# Patient Record
Sex: Female | Born: 1980 | Race: White | Hispanic: No | Marital: Single | Smoking: Never smoker
Health system: Southern US, Community
[De-identification: ages and names within clinical notes are randomized; demographics above are authoritative.]

## PROBLEM LIST (undated history)

## (undated) DIAGNOSIS — J45909 Unspecified asthma, uncomplicated: Secondary | ICD-10-CM

## (undated) HISTORY — PX: THERAPEUTIC ABORTION: SHX798

## (undated) HISTORY — PX: WISDOM TOOTH EXTRACTION: SHX21

---

## 2001-02-23 ENCOUNTER — Inpatient Hospital Stay (HOSPITAL_COMMUNITY): Admission: AD | Admit: 2001-02-23 | Discharge: 2001-02-25 | Payer: Self-pay | Admitting: Obstetrics and Gynecology

## 2001-05-18 ENCOUNTER — Encounter: Payer: Self-pay | Admitting: Obstetrics and Gynecology

## 2001-05-18 ENCOUNTER — Ambulatory Visit (HOSPITAL_COMMUNITY): Admission: RE | Admit: 2001-05-18 | Discharge: 2001-05-18 | Payer: Self-pay | Admitting: Obstetrics and Gynecology

## 2001-07-06 ENCOUNTER — Other Ambulatory Visit: Admission: RE | Admit: 2001-07-06 | Discharge: 2001-07-06 | Payer: Self-pay | Admitting: Obstetrics and Gynecology

## 2006-02-12 ENCOUNTER — Ambulatory Visit: Payer: Self-pay | Admitting: Family Medicine

## 2007-11-23 ENCOUNTER — Emergency Department (HOSPITAL_COMMUNITY): Admission: EM | Admit: 2007-11-23 | Discharge: 2007-11-23 | Payer: Self-pay | Admitting: Emergency Medicine

## 2009-07-12 ENCOUNTER — Emergency Department (HOSPITAL_COMMUNITY): Admission: EM | Admit: 2009-07-12 | Discharge: 2009-07-12 | Payer: Self-pay | Admitting: Emergency Medicine

## 2010-02-07 ENCOUNTER — Ambulatory Visit: Payer: Self-pay | Admitting: Family Medicine

## 2010-02-07 DIAGNOSIS — S6390XA Sprain of unspecified part of unspecified wrist and hand, initial encounter: Secondary | ICD-10-CM | POA: Insufficient documentation

## 2010-10-28 NOTE — Assessment & Plan Note (Signed)
Summary: L pinky finger pain x 1.5 wk rm 3   Vital Signs:  Patient Profile:   30 Years Old Female CC:      L pinky finger x 1.5 wk ago Height:     65.5 inches Weight:      166 pounds O2 Sat:      100 % O2 treatment:    Room Air Temp:     98.6 degrees F oral Pulse rate:   98 / minute Pulse rhythm:   regular Resp:     16 per minute BP sitting:   121 / 789  (right arm) Cuff size:   regular  Vitals Entered By: Areta Haber CMA (Feb 07, 2010 7:12 PM)                  Current Allergies (reviewed today): ! FLAGYL     History of Present Illness Chief Complaint: L pinky finger x 1.5 wk ago History of Present Illness: Subjective:  Patient complains of sudden onset of pain in her left 5th fingertip 2 weeks ago when she was squeezing water from some fabric.  REVIEW OF SYSTEMS Constitutional Symptoms      Denies fever, chills, night sweats, weight loss, weight gain, and fatigue.  Eyes       Denies change in vision, eye pain, eye discharge, glasses, contact lenses, and eye surgery. Ear/Nose/Throat/Mouth       Denies hearing loss/aids, change in hearing, ear pain, ear discharge, dizziness, frequent runny nose, frequent nose bleeds, sinus problems, sore throat, hoarseness, and tooth pain or bleeding.  Respiratory       Denies dry cough, productive cough, wheezing, shortness of breath, asthma, bronchitis, and emphysema/COPD.  Cardiovascular       Denies murmurs, chest pain, and tires easily with exhertion.    Gastrointestinal       Denies stomach pain, nausea/vomiting, diarrhea, constipation, blood in bowel movements, and indigestion. Genitourniary       Denies painful urination, kidney stones, and loss of urinary control. Neurological       Denies paralysis, seizures, and fainting/blackouts. Musculoskeletal       Complains of decreased range of motion, redness, and swelling.      Denies muscle pain, joint pain, joint stiffness, muscle weakness, and gout.      Comments: L  Pinky finger x 1.5 wk  Skin       Denies bruising, unusual mles/lumps or sores, and hair/skin or nail changes.  Psych       Denies mood changes, temper/anger issues, anxiety/stress, speech problems, depression, and sleep problems. Other Comments: Pt has not seen PCP for this.    Objective:  Left 5th finger:  vague tenderness distal phalanx without deformity, swelling, erythema.  All joints have full range of motion.  Distal neurovascular intact  X-ray left 5th finger:  negative Assessment New Problems: FINGER SPRAIN (ICD-842.10)   Plan New Orders: T-DG Finger Little*L* [73140] New Patient Level III [id number] Planning Comments:   Ibuprofen 200mg , 4 tabs every 8 hours with food.  Begin range of motion exercises ([employer] information and instruction patient handout given).  Apply ice pack 2 or 3 times daily. Follow-up with PCP if not improving.   The patient and/or caregiver has been counseled thoroughly with regard to medications prescribed including dosage, schedule, interactions, rationale for use, and possible side effects and they verbalize understanding.  Diagnoses and expected course of recovery discussed and will return if not improved as expected or if the condition worsens.  Patient and/or caregiver verbalized understanding.

## 2016-12-30 ENCOUNTER — Other Ambulatory Visit: Payer: Self-pay | Admitting: Adult Health

## 2017-07-02 ENCOUNTER — Ambulatory Visit: Payer: Self-pay | Admitting: Family Medicine

## 2017-07-06 NOTE — Progress Notes (Deleted)
   Subjective: ZO:XWRUEAVWU care, *** HPI: Linda Schmidt is a 36 y.o. female presenting to clinic today for:  1. ***  No past medical history on file. No past surgical history on file. Social History   Social History  . Marital status: Married    Spouse name: N/A  . Number of children: N/A  . Years of education: N/A   Occupational History  . Not on file.   Social History Main Topics  . Smoking status: Not on file  . Smokeless tobacco: Not on file  . Alcohol use Not on file  . Drug use: Unknown  . Sexual activity: Not on file   Other Topics Concern  . Not on file   Social History Narrative  . No narrative on file   No outpatient prescriptions have been marked as taking for the 07/13/17 encounter (Appointment) with Raliegh Ip, DO.   No family history on file. Allergies  Allergen Reactions  . Metronidazole     REACTION: Rash     Health Maintenance: ***  Flu Vaccine: {YES/NO/WILD JWJXB:14782}  Tdap Vaccine: {YES/NO/WILD NFAOZ:30865}  - every 68yrs - (<3 lifetime doses or unknown): all wounds -- look up need for Tetanus IG - (>=3 lifetime doses): clean/minor wound if >85yrs from previous; all other wounds if >46yrs from previous Zoster Vaccine: {YES/NO/WILD CARDS:18581} (those >50yo, once) Pneumonia Vaccine: {YES/NO/WILD HQION:62952} (those w/ risk factors) - (<63yr) Both: Immunocompromised, cochlear implant, CSF leak, asplenic, sickle cell, Chronic Renal Failure - (<74yr) PPSV-23 only: Heart dz, lung disease, DM, tobacco abuse, alcoholism, cirrhosis/liver disease. - (>63yr): PPSV13 then PPSV23 in 6-12mths;  - (>34yr): repeat PPSV23 once if pt received prior to 37yo and 27yrs have passed  ROS: Per HPI  Objective: Office vital signs reviewed. There were no vitals taken for this visit.  Physical Examination:  General: Awake, alert, *** nourished, No acute distress HEENT: Normal    Neck: No masses palpated. No lymphadenopathy    Ears: Tympanic  membranes intact, normal light reflex, no erythema, no bulging    Eyes: PERRLA, extraocular movement in tact, sclera ***    Nose: nasal turbinates moist, *** nasal discharge    Throat: moist mucus membranes, no erythema, *** tonsillar exudate.  Airway is patent Cardio: regular rate and rhythm, S1S2 heard, no murmurs appreciated Pulm: clear to auscultation bilaterally, no wheezes, rhonchi or rales; normal work of breathing on room air GI: soft, non-tender, non-distended, bowel sounds present x4, no hepatomegaly, no splenomegaly, no masses GU: external vaginal tissue ***, cervix ***, *** punctate lesions on cervix appreciated, *** discharge from cervical os, *** bleeding, *** cervical motion tenderness, *** abdominal/ adnexal masses Extremities: warm, well perfused, No edema, cyanosis or clubbing; +*** pulses bilaterally MSK: *** gait and *** station Skin: dry; intact; no rashes or lesions Neuro: *** Strength and light touch sensation grossly intact, *** DTRs ***/4  Assessment/ Plan: 36 y.o. female   No problem-specific Assessment & Plan notes found for this encounter.   Raliegh Ip, DO [facility] [phone]

## 2017-07-07 ENCOUNTER — Ambulatory Visit: Payer: Self-pay | Admitting: Family Medicine

## 2017-07-13 ENCOUNTER — Ambulatory Visit: Payer: Self-pay | Admitting: Family Medicine

## 2017-07-13 ENCOUNTER — Encounter: Payer: Self-pay | Admitting: General Practice

## 2017-08-12 ENCOUNTER — Emergency Department (HOSPITAL_COMMUNITY)
Admission: EM | Admit: 2017-08-12 | Discharge: 2017-08-12 | Disposition: A | Discharge status: Home / Self Care | Payer: No Typology Code available for payment source | Attending: Emergency Medicine | Admitting: Emergency Medicine

## 2017-08-12 ENCOUNTER — Emergency Department (HOSPITAL_COMMUNITY): Payer: No Typology Code available for payment source

## 2017-08-12 ENCOUNTER — Encounter (HOSPITAL_COMMUNITY): Payer: Self-pay | Admitting: *Deleted

## 2017-08-12 ENCOUNTER — Other Ambulatory Visit: Payer: Self-pay

## 2017-08-12 DIAGNOSIS — Y9389 Activity, other specified: Secondary | ICD-10-CM | POA: Diagnosis not present

## 2017-08-12 DIAGNOSIS — Z79899 Other long term (current) drug therapy: Secondary | ICD-10-CM | POA: Diagnosis not present

## 2017-08-12 DIAGNOSIS — S60211A Contusion of right wrist, initial encounter: Secondary | ICD-10-CM | POA: Diagnosis not present

## 2017-08-12 DIAGNOSIS — J45909 Unspecified asthma, uncomplicated: Secondary | ICD-10-CM | POA: Insufficient documentation

## 2017-08-12 DIAGNOSIS — Y998 Other external cause status: Secondary | ICD-10-CM | POA: Diagnosis not present

## 2017-08-12 DIAGNOSIS — Y92410 Unspecified street and highway as the place of occurrence of the external cause: Secondary | ICD-10-CM | POA: Diagnosis not present

## 2017-08-12 DIAGNOSIS — S6991XA Unspecified injury of right wrist, hand and finger(s), initial encounter: Secondary | ICD-10-CM | POA: Diagnosis present

## 2017-08-12 DIAGNOSIS — S29012A Strain of muscle and tendon of back wall of thorax, initial encounter: Secondary | ICD-10-CM | POA: Insufficient documentation

## 2017-08-12 HISTORY — DX: Unspecified asthma, uncomplicated: J45.909

## 2017-08-12 MED ORDER — CYCLOBENZAPRINE HCL 10 MG PO TABS
10.0000 mg | ORAL_TABLET | Freq: Once | ORAL | Status: AC
  Administered 2017-08-12: 10 mg via ORAL
  Filled 2017-08-12: qty 1

## 2017-08-12 MED ORDER — CYCLOBENZAPRINE HCL 10 MG PO TABS: 10.0000 mg | ORAL_TABLET | Freq: Three times a day (TID) | ORAL | 0 refills | Status: DC | PRN

## 2017-08-12 MED ORDER — HYDROCODONE-ACETAMINOPHEN 5-325 MG PO TABS
1.0000 | ORAL_TABLET | Freq: Once | ORAL | Status: AC
  Administered 2017-08-12: 1 via ORAL
  Filled 2017-08-12: qty 1

## 2017-08-12 MED ORDER — TRAMADOL HCL 50 MG PO TABS: 50.0000 mg | ORAL_TABLET | Freq: Four times a day (QID) | ORAL | 0 refills | Status: DC | PRN

## 2017-08-12 NOTE — Discharge Instructions (Signed)
Apply ice packs on/off to your wrist and upper back.  Follow-up with your primary doctor for recheck or you can contact one of the orthopedic providers listed to arrange a follow-up if needed.
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
## 2017-08-25 ENCOUNTER — Other Ambulatory Visit: Payer: Self-pay

## 2017-08-25 ENCOUNTER — Emergency Department (HOSPITAL_COMMUNITY): Payer: No Typology Code available for payment source

## 2017-08-25 ENCOUNTER — Emergency Department (HOSPITAL_COMMUNITY)
Admission: EM | Admit: 2017-08-25 | Discharge: 2017-08-26 | Disposition: A | Discharge status: Home / Self Care | Payer: No Typology Code available for payment source | Attending: Emergency Medicine | Admitting: Emergency Medicine

## 2017-08-25 DIAGNOSIS — Z79899 Other long term (current) drug therapy: Secondary | ICD-10-CM | POA: Insufficient documentation

## 2017-08-25 DIAGNOSIS — H9312 Tinnitus, left ear: Secondary | ICD-10-CM | POA: Diagnosis not present

## 2017-08-25 DIAGNOSIS — R51 Headache: Secondary | ICD-10-CM | POA: Insufficient documentation

## 2017-08-25 DIAGNOSIS — R519 Headache, unspecified: Secondary | ICD-10-CM

## 2017-08-25 DIAGNOSIS — J45909 Unspecified asthma, uncomplicated: Secondary | ICD-10-CM | POA: Diagnosis not present

## 2017-08-25 DIAGNOSIS — S060X0D Concussion without loss of consciousness, subsequent encounter: Secondary | ICD-10-CM | POA: Diagnosis not present

## 2017-08-25 DIAGNOSIS — S5011XD Contusion of right forearm, subsequent encounter: Secondary | ICD-10-CM | POA: Insufficient documentation
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
## 2017-08-26 ENCOUNTER — Emergency Department (HOSPITAL_COMMUNITY): Payer: No Typology Code available for payment source
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
## 2017-08-26 NOTE — Discharge Instructions (Signed)
You can take ibuprofen 600 mg 4 times a day for pain in your forearm or your head as needed. Call Dr Ronal Fearoonquah's office to be evaluated for a possible concussion. Your head CT scan does not show any acute injury, you do have evidence of sinus problems. Please get an appointment with Dr Suszanne Connerseoh, an ENT to evaluate your left ear. Unfortunately there is no specific treatment for concussion, it is a matter of time for the healing process to occur. There are tests they can do to monitor your concussion to make sure you are improving.

## 2017-09-01 ENCOUNTER — Ambulatory Visit: Payer: Self-pay | Admitting: Family Medicine

## 2017-09-23 ENCOUNTER — Ambulatory Visit: Payer: Self-pay | Admitting: Family Medicine

## 2018-03-27 ENCOUNTER — Emergency Department (HOSPITAL_COMMUNITY)
Admission: EM | Admit: 2018-03-27 | Discharge: 2018-03-27 | Disposition: A | Discharge status: Home / Self Care | Payer: No Typology Code available for payment source | Attending: Emergency Medicine | Admitting: Emergency Medicine

## 2018-03-27 ENCOUNTER — Emergency Department (HOSPITAL_COMMUNITY): Payer: No Typology Code available for payment source

## 2018-03-27 ENCOUNTER — Other Ambulatory Visit: Payer: Self-pay

## 2018-03-27 ENCOUNTER — Encounter (HOSPITAL_COMMUNITY): Payer: Self-pay | Admitting: Emergency Medicine

## 2018-03-27 DIAGNOSIS — J45909 Unspecified asthma, uncomplicated: Secondary | ICD-10-CM | POA: Insufficient documentation

## 2018-03-27 DIAGNOSIS — M79671 Pain in right foot: Secondary | ICD-10-CM | POA: Insufficient documentation

## 2018-03-27 DIAGNOSIS — M79661 Pain in right lower leg: Secondary | ICD-10-CM | POA: Insufficient documentation

## 2018-03-27 MED ORDER — ACETAMINOPHEN 500 MG PO TABS: 500.0000 mg | ORAL_TABLET | Freq: Four times a day (QID) | ORAL | 0 refills | Status: DC | PRN

## 2018-03-27 MED ORDER — IBUPROFEN 400 MG PO TABS
600.0000 mg | ORAL_TABLET | Freq: Once | ORAL | Status: AC
  Administered 2018-03-27: 600 mg via ORAL
  Filled 2018-03-27: qty 2

## 2018-03-27 MED ORDER — IBUPROFEN 600 MG PO TABS: 600.0000 mg | ORAL_TABLET | Freq: Four times a day (QID) | ORAL | 0 refills | Status: DC | PRN

## 2018-03-27 NOTE — Discharge Instructions (Addendum)
1. Medications: Alternate 600 mg of ibuprofen and [phone] mg of Tylenol every 3 hours as needed for pain. Do not exceed 4000 mg of Tylenol daily.  Take ibuprofen with food to avoid upset stomach issues. 2. Treatment: rest, ice, elevate and use brace, drink plenty of fluids, gentle stretching 3. Follow Up: Please followup with orthopedics as directed or your PCP in 1 week if no improvement for discussion of your diagnoses and further evaluation after today's visit; if you do not have a primary care doctor use the resource guide provided to find one; Please return to the ER for worsening symptoms or other concerns such as fever, severe swelling, loss of pulses, or weakness

## 2018-03-27 NOTE — ED Triage Notes (Signed)
Pt c/o right lower leg pain since fall last night.
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
## 2019-11-20 ENCOUNTER — Encounter: Payer: PRIVATE HEALTH INSURANCE | Admitting: Adult Health

## 2019-12-04 ENCOUNTER — Other Ambulatory Visit: Payer: Self-pay

## 2019-12-04 ENCOUNTER — Ambulatory Visit (INDEPENDENT_AMBULATORY_CARE_PROVIDER_SITE_OTHER): Payer: 59 | Admitting: Adult Health

## 2019-12-04 ENCOUNTER — Encounter: Payer: Self-pay | Admitting: Adult Health

## 2019-12-04 VITALS — BP 141/96 | HR 98 | Ht 67.0 in | Wt 202.0 lb

## 2019-12-04 DIAGNOSIS — K649 Unspecified hemorrhoids: Secondary | ICD-10-CM | POA: Insufficient documentation

## 2019-12-04 DIAGNOSIS — R195 Other fecal abnormalities: Secondary | ICD-10-CM

## 2019-12-04 DIAGNOSIS — N898 Other specified noninflammatory disorders of vagina: Secondary | ICD-10-CM | POA: Insufficient documentation

## 2019-12-04 LAB — HEMOCCULT GUIAC POC 1CARD (OFFICE): Fecal Occult Blood, POC: POSITIVE — AB

## 2019-12-04 MED ORDER — PRAMOXINE-HC 1-1 % EX CREA: TOPICAL_CREAM | Freq: Three times a day (TID) | CUTANEOUS | 1 refills | Status: DC

## 2019-12-04 NOTE — Progress Notes (Signed)
Patient ID: Linda Schmidt, female   DOB: 07-13-1981, 39 y.o.   MRN: [id number] History of Present Illness: Linda Schmidt is a 39 year old white female,single, G2P1011, in complaining of itching in vaginal area and in butt for about a year.Has used different toilet papers.   Current Medications, Allergies, Past Medical History, Past Surgical History, Family History and Social History were reviewed in [employer] record.     Review of Systems: Has itching in vaginal area and "in butt" for about a year Has seen blood when wipes Not currently sexually active     Physical Exam:BP (!) 141/96 (BP Location: Left Arm, Patient Position: Sitting, Cuff Size: Normal)   Pulse 98   Ht 5\' 7"  (1.702 m)   Wt 202 lb (91.6 kg)   LMP 11/13/2019 (Exact Date)   BMI 31.64 kg/m  General:  Well developed, well nourished, no acute distress Skin:  Warm and dry Neck:  Midline trachea, normal thyroid, good ROM, no lymphadenopathy Lungs; Clear to auscultation bilaterally Cardiovascular: Regular rate and rhythm Pelvic:  External genitalia is normal in appearance, no lesions.  The vagina is normal in appearance,white discharge, no odor. Urethra has no lesions or masses. The cervix is bulbous.  Uterus is felt to be normal size, shape, and contour.  No adnexal masses or tenderness noted.Bladder is non tender, no masses felt. Rectal: Good sphincter tone, no polyps, +hemorrhoids felt.  Hemoccult positive. Extremities/musculoskeletal:  No swelling or varicosities noted, no clubbing or cyanosis Psych:  No mood changes, alert and cooperative,seems happy Fall risk is low PHQ 2 score is 0. Examination chaperoned by 11/15/2019 Rash LPN  Impression and Plan: 1. Hemorrhoids, unspecified hemorrhoid type Will rx analpram HC Meds ordered this encounter  Medications  . pramoxine-hydrocortisone (ANALPRAM HC) cream    Sig: Apply topically 3 (three) times daily.    Dispense:  30 g    Refill:  1    Order Specific  Question:   Supervising Provider    Answer:   Marchelle Folks, LUTHER H [[id number]]  Use wet wipes   2. Itching in the vaginal area Nuswab sent  3. Positive fecal occult blood test 3 hemoccult cards sent home with her to do and bring back, if any +will refer to GI   4. Vaginal discharge Nuswab sent  Will talk when results back Follow up prn

## 2019-12-11 LAB — NUSWAB VAGINITIS PLUS (VG+)
Candida albicans, NAA: NEGATIVE
Candida glabrata, NAA: NEGATIVE
Chlamydia trachomatis, NAA: NEGATIVE
Neisseria gonorrhoeae, NAA: NEGATIVE
Trich vag by NAA: NEGATIVE

## 2019-12-14 ENCOUNTER — Ambulatory Visit: Payer: 59

## 2019-12-14 ENCOUNTER — Ambulatory Visit (INDEPENDENT_AMBULATORY_CARE_PROVIDER_SITE_OTHER)
Admission: RE | Admit: 2019-12-14 | Discharge: 2019-12-14 | Disposition: A | Discharge status: Other Acute Inpatient Hospital | Payer: 59 | Source: Ambulatory Visit

## 2019-12-14 ENCOUNTER — Ambulatory Visit
Admission: EM | Admit: 2019-12-14 | Discharge: 2019-12-14 | Disposition: A | Discharge status: Home / Self Care | Payer: 59 | Attending: Emergency Medicine | Admitting: Emergency Medicine

## 2019-12-14 ENCOUNTER — Other Ambulatory Visit: Payer: Self-pay

## 2019-12-14 ENCOUNTER — Inpatient Hospital Stay: Admission: RE | Admit: 2019-12-14 | Payer: 59 | Source: Ambulatory Visit

## 2019-12-14 ENCOUNTER — Ambulatory Visit (INDEPENDENT_AMBULATORY_CARE_PROVIDER_SITE_OTHER): Payer: 59

## 2019-12-14 DIAGNOSIS — Z20822 Contact with and (suspected) exposure to covid-19: Secondary | ICD-10-CM

## 2019-12-14 DIAGNOSIS — R05 Cough: Secondary | ICD-10-CM | POA: Diagnosis not present

## 2019-12-14 DIAGNOSIS — R0602 Shortness of breath: Secondary | ICD-10-CM | POA: Diagnosis not present

## 2019-12-14 DIAGNOSIS — R0789 Other chest pain: Secondary | ICD-10-CM | POA: Diagnosis not present

## 2019-12-14 DIAGNOSIS — J4530 Mild persistent asthma, uncomplicated: Secondary | ICD-10-CM | POA: Diagnosis not present

## 2019-12-14 DIAGNOSIS — R058 Other specified cough: Secondary | ICD-10-CM

## 2019-12-14 MED ORDER — ALBUTEROL SULFATE HFA 108 (90 BASE) MCG/ACT IN AERS: 1.0000 | INHALATION_SPRAY | Freq: Four times a day (QID) | RESPIRATORY_TRACT | 0 refills | Status: DC | PRN

## 2019-12-14 MED ORDER — PREDNISONE 10 MG PO TABS: 20.0000 mg | ORAL_TABLET | Freq: Every day | ORAL | 0 refills | Status: DC

## 2019-12-14 MED ORDER — BENZONATATE 100 MG PO CAPS: 100.0000 mg | ORAL_CAPSULE | Freq: Three times a day (TID) | ORAL | 0 refills | Status: DC | PRN

## 2019-12-14 MED ORDER — PROMETHAZINE-DM 6.25-15 MG/5ML PO SYRP: 5.0000 mL | ORAL_SOLUTION | Freq: Every evening | ORAL | 0 refills | Status: DC | PRN

## 2019-12-14 NOTE — ED Triage Notes (Signed)
Pt reports had cold around March 8 or 9th.  Reports was sick for a few days.  Reports symptoms improved but congestion never went away.  Pt c/o pain in left lung with deep breathing.

## 2019-12-14 NOTE — Discharge Instructions (Addendum)
COVID testing ordered.  It will take between 2-7 days for test results.  Someone will contact you regarding abnormal results.    In the meantime: You should remain isolated in your home for 10 days from symptom onset AND greater than 24 hours after symptoms resolution (absence of fever without the use of fever-reducing medication and improvement in respiratory symptoms), whichever is longer Get plenty of rest and push fluids Prednisone was prescribed  Patient was advised to take benzonatate and ProAir as prescribed at previous visit. use medications daily for symptom relief Use OTC medications like ibuprofen or tylenol as needed fever or pain Call or go to the ED if you have any new or worsening symptoms such as fever, worsening cough, shortness of breath, chest tightness, chest pain, turning blue, changes in mental status, etc..Marland Kitchen

## 2019-12-14 NOTE — ED Provider Notes (Signed)
Virtual Visit via Video Note:  Linda Schmidt  initiated request for Telemedicine visit with [facility]gent [facility] team. I connected with Linda Schmidt  on 12/14/2019 at 6:35 PM  for a synchronized telemedicine visit using a video enabled HIPPA compliant telemedicine application. I verified that I am speaking with Linda Schmidt  using two identifiers. Jaynee Eagles, PA-C  was physically located in a [facility] Urgent care site and Linda Schmidt was located at a different location.   The limitations of evaluation and management by telemedicine as well as the availability of in-person appointments were discussed. Patient was informed that she  may incur a bill ( including co-pay) for this virtual visit encounter. Linda Schmidt  expressed understanding and gave verbal consent to proceed with virtual visit.     History of Present Illness:Linda Schmidt  is a 39 y.o. female presents with 10 day hx of persistent sinus congestion, chest congestion. Sx started throat pain, cold type symptoms. She feels like this has improved but her chest continues to bother her over her left lower chest/lung. Has had some shob, wheezing. Has a hx of asthma. Has been using her daughter's albuterol inhaler but does not have her own. Has not gotten COVID vaccinated. Works in [employer], has been out of work the past 3 weeks.  Has been taking decongestant, allergy medications.   ROS  No current facility-administered medications for this encounter.   Current Outpatient Medications  Medication Sig Dispense Refill  . acetaminophen (TYLENOL) 500 MG tablet Take 1 tablet (500 mg total) by mouth every 6 (six) hours as needed. 30 tablet 0  . ibuprofen (ADVIL,MOTRIN) 600 MG tablet Take 1 tablet (600 mg total) by mouth every 6 (six) hours as needed. 30 tablet 0  . Magnesium-Zinc 133.33-5 MG TABS Take 1 tablet by mouth daily.     Marland Kitchen omeprazole (PRILOSEC) 20 MG capsule Take 20 mg daily by mouth.    . pramoxine-hydrocortisone  (ANALPRAM HC) cream Apply topically 3 (three) times daily. 30 g 1     Allergies  Allergen Reactions  . Catfish [Fish Allergy]   . Metronidazole Rash  . Sulfa Antibiotics Hives     Past Medical History:  Diagnosis Date  . Asthma     Past Surgical History:  Procedure Laterality Date  . THERAPEUTIC ABORTION    . WISDOM TOOTH EXTRACTION        Observations/Objective: Physical Exam Constitutional:      General: She is not in acute distress.    Appearance: Normal appearance. She is well-developed. She is not ill-appearing, toxic-appearing or diaphoretic.  Eyes:     Extraocular Movements: Extraocular movements intact.  Pulmonary:     Effort: Pulmonary effort is normal.  Neurological:     General: No focal deficit present.     Mental Status: She is alert and oriented to person, place, and time.  Psychiatric:        Mood and Affect: Mood normal.        Behavior: Behavior normal.        Thought Content: Thought content normal.        Judgment: Judgment normal.      Assessment and Plan:  1. Productive cough   2. Atypical chest pain   3. Shortness of breath   4. Mild persistent asthma without complication    I provided patient with her own prescription for albuterol inhaler, cough medications.  Highly encourage patient to be seen in  clinic so that she could be evaluated, have a lung exam, consider COVID-19 testing and a chest x-ray.  Patient is agreeable with this.  Plans on reporting to the urgent care at [facility].   Follow Up Instructions:    I discussed the assessment and treatment plan with the patient. The patient was provided an opportunity to ask questions and all were answered. The patient agreed with the plan and demonstrated an understanding of the instructions.   The patient was advised to call back or seek an in-person evaluation if the symptoms worsen or if the condition fails to improve as anticipated.  I provided 10 minutes of non-face-to-face time  during this encounter.    Wallis Bamberg, PA-C  12/14/2019 6:35 PM         Wallis Bamberg, PA-C 12/14/19 1845

## 2019-12-14 NOTE — Discharge Instructions (Addendum)
I recommend you come in for an office visit so that we can do a lung exam, consider getting a chest x-ray to rule out pneumonia and also get testing for COVID-19.  [facility] at [facility] [place] COVID-19 info: [email or web address] Get online care: [email or web address] Address: [address] [place], [place] [address] Phone: [phone]

## 2019-12-14 NOTE — ED Provider Notes (Signed)
[facility]    CSN: [id number] Arrival date & time: 12/14/19  1945      History   Chief Complaint Chief Complaint  Patient presents with  . Cough    HPI Linda Schmidt is a 39 y.o. female.   who presented  the urgent care with a  complaint of sinus congestion, chest congestion, sore throat, shortness of breath, cough and left chest tightness on breathing for the past 10 days..  Denies sick exposure to COVID, flu or strep.  Denies recent travel.  Denies aggravating or alleviating symptoms.  Denies previous COVID infection.   Denies fever, chills, fatigue, nasal congestion, rhinorrhea,   wheezing, chest pain, nausea, vomiting, changes in bowel or bladder habits.       Past Medical History:  Diagnosis Date  . Asthma     Patient Active Problem List   Diagnosis Date Noted  . Itching in the vaginal area 12/04/2019  . Hemorrhoids 12/04/2019  . Positive fecal occult blood test 12/04/2019  . FINGER SPRAIN 02/07/2010    Past Surgical History:  Procedure Laterality Date  . THERAPEUTIC ABORTION    . WISDOM TOOTH EXTRACTION      OB History    Gravida  2   Para  1   Term  1   Preterm      AB  1   Living  1     SAB      TAB  1   Ectopic      Multiple      Live Births               Home Medications    Prior to Admission medications   Medication Sig Start Date End Date Taking? Authorizing Provider  acetaminophen (TYLENOL) 500 MG tablet Take 1 tablet (500 mg total) by mouth every 6 (six) hours as needed. 03/27/18  Yes Fawze, Mina A, PA-C  albuterol (VENTOLIN HFA) 108 (90 Base) MCG/ACT inhaler Inhale 1-2 puffs into the lungs every 6 (six) hours as needed for wheezing or shortness of breath. 12/14/19  Yes Wallis Bamberg, PA-C  ibuprofen (ADVIL,MOTRIN) 600 MG tablet Take 1 tablet (600 mg total) by mouth every 6 (six) hours as needed. 03/27/18  Yes Fawze, Mina A, PA-C  Magnesium-Zinc 133.33-5 MG TABS Take 1 tablet by mouth daily.    Yes [provider]  omeprazole (PRILOSEC) 20 MG capsule Take 20 mg daily by mouth.   Yes [provider]  benzonatate (TESSALON) 100 MG capsule Take 1-2 capsules (100-200 mg total) by mouth 3 (three) times daily as needed. 12/14/19   Wallis Bamberg, PA-C  pramoxine-hydrocortisone [facility]) cream Apply topically 3 (three) times daily. 12/04/19   Adline Potter, NP  predniSONE (DELTASONE) 10 MG tablet Take 2 tablets (20 mg total) by mouth daily. 12/14/19   Stanislav Gervase, Zachery Dakins, FNP  promethazine-dextromethorphan (PROMETHAZINE-DM) 6.25-15 MG/5ML syrup Take 5 mLs by mouth at bedtime as needed for cough. 12/14/19   Wallis Bamberg, PA-C    Family History Family History  Problem Relation Age of Onset  . COPD Father   . Emphysema Father   . Hypertension Father   . Hypertension Mother   . Stroke Mother   . Multiple sclerosis Mother   . COPD Mother     Social History Social History   Tobacco Use  . Smoking status: Never Smoker  . Smokeless tobacco: Never Used  Substance Use Topics  . Alcohol use: Yes    Comment:  4 times weekly   . Drug use: No     Allergies   Catfish [fish allergy], Metronidazole, and Sulfa antibiotics   Review of Systems Review of Systems  Constitutional: Negative.   HENT: Positive for congestion and sore throat.   Respiratory: Positive for cough, chest tightness and shortness of breath.   Cardiovascular: Negative.   Gastrointestinal: Negative.   Neurological: Negative.   All other systems reviewed and are negative.    Physical Exam Triage Vital Signs ED Triage Vitals  Enc Vitals Group     BP      Pulse      Resp      Temp      Temp src      SpO2      Weight      Height      Head Circumference      Peak Flow      Pain Score      Pain Loc      Pain Edu?      Excl. in GC?    No data found.  Updated Vital Signs BP (!) 137/98 (BP Location: Right Arm)   Pulse 96   Temp 98.5 F (36.9 C) (Oral)   Resp 18   Ht 5\' 7"  (1.702 m)   Wt 200 lb  (90.7 kg)   LMP 12/11/2019   SpO2 97%   BMI 31.32 kg/m   Visual Acuity Right Eye Distance:   Left Eye Distance:   Bilateral Distance:    Right Eye Near:   Left Eye Near:    Bilateral Near:     Physical Exam Vitals and nursing note reviewed.  Constitutional:      General: She is not in acute distress.    Appearance: Normal appearance. She is normal weight. She is not ill-appearing or toxic-appearing.  HENT:     Head: Normocephalic.     Right Ear: Tympanic membrane, ear canal and external ear normal. There is no impacted cerumen.     Left Ear: Tympanic membrane, ear canal and external ear normal. There is no impacted cerumen.     Nose: Nose normal. No congestion.     Mouth/Throat:     Mouth: Mucous membranes are moist.     Pharynx: Oropharynx is clear. No oropharyngeal exudate or posterior oropharyngeal erythema.  Cardiovascular:     Rate and Rhythm: Normal rate and regular rhythm.     Pulses: Normal pulses.     Heart sounds: Normal heart sounds. No murmur.  Pulmonary:     Effort: Pulmonary effort is normal. No respiratory distress.     Breath sounds: Normal breath sounds. No wheezing or rhonchi.  Chest:     Chest wall: No tenderness.  Abdominal:     General: Abdomen is flat. Bowel sounds are normal. There is no distension.     Palpations: There is no mass.     Tenderness: There is no abdominal tenderness. There is left CVA tenderness.  Skin:    Capillary Refill: Capillary refill takes less than 2 seconds.  Neurological:     General: No focal deficit present.     Mental Status: She is alert and oriented to person, place, and time.      UC Treatments / Results  Labs (all labs ordered are listed, but only abnormal results are displayed) Labs Reviewed  NOVEL CORONAVIRUS, NAA    EKG   Radiology DG Chest 2 View  Result Date: 12/14/2019 CLINICAL DATA:  Cough and  short of breath EXAM: CHEST - 2 VIEW COMPARISON:  11/23/2007 FINDINGS: The heart size and mediastinal  contours are within normal limits. Both lungs are clear. The visualized skeletal structures are unremarkable. IMPRESSION: No active cardiopulmonary disease. Electronically Signed   By: Donavan Foil M.D.   On: 12/14/2019 20:17    Procedures Procedures (including critical care time)  Medications Ordered in UC Medications - No data to display  Initial Impression / Assessment and Plan / UC Course  I have reviewed the triage vital signs and the nursing notes.  Pertinent labs & imaging results that were available during my care of the patient were reviewed by me and considered in my medical decision making (see chart for details).     Patient is stable for discharge.   COVID-19 test was ordered Chest x-ray was ordered. X-ray is negative for for cardiopulmonary disease.  I have reviewed the x-ray myself and the radiologist interpretation.  I am in agreement with the radiologist interpretation. CVA tenderness may be due to intercostal chondritis cause by excessive cough.  Was advised to go to ED if symptom does not resolve or worsening symptoms.  Final Clinical Impressions(s) / UC Diagnoses   Final diagnoses:  Suspected COVID-19 virus infection     Discharge Instructions     COVID testing ordered.  It will take between 2-7 days for test results.  Someone will contact you regarding abnormal results.    In the meantime: You should remain isolated in your home for 10 days from symptom onset AND greater than 24 hours after symptoms resolution (absence of fever without the use of fever-reducing medication and improvement in respiratory symptoms), whichever is longer Get plenty of rest and push fluids Prednisone was prescribed  Patient was advised to take benzonatate and ProAir as prescribed at previous visit. use medications daily for symptom relief Use OTC medications like ibuprofen or tylenol as needed fever or pain Call or go to the ED if you have any new or worsening symptoms such as  fever, worsening cough, shortness of breath, chest tightness, chest pain, turning blue, changes in mental status, etc...     ED Prescriptions    Medication Sig Dispense Auth. Provider   predniSONE (DELTASONE) 10 MG tablet Take 2 tablets (20 mg total) by mouth daily. 15 tablet Zade Falkner, Darrelyn Hillock, FNP     PDMP not reviewed this encounter.   Emerson Monte, [place] 12/14/19 2029

## 2019-12-15 LAB — NOVEL CORONAVIRUS, NAA: SARS-CoV-2, NAA: NOT DETECTED

## 2020-12-30 ENCOUNTER — Other Ambulatory Visit (INDEPENDENT_AMBULATORY_CARE_PROVIDER_SITE_OTHER): Payer: BC Managed Care – PPO

## 2020-12-30 ENCOUNTER — Other Ambulatory Visit (HOSPITAL_COMMUNITY)
Admission: RE | Admit: 2020-12-30 | Discharge: 2020-12-30 | Disposition: A | Discharge status: Home / Self Care | Payer: BC Managed Care – PPO | Source: Ambulatory Visit | Attending: Obstetrics & Gynecology | Admitting: Obstetrics & Gynecology

## 2020-12-30 ENCOUNTER — Other Ambulatory Visit: Payer: Self-pay

## 2020-12-30 DIAGNOSIS — Z113 Encounter for screening for infections with a predominantly sexual mode of transmission: Secondary | ICD-10-CM | POA: Insufficient documentation

## 2020-12-30 DIAGNOSIS — R399 Unspecified symptoms and signs involving the genitourinary system: Secondary | ICD-10-CM

## 2020-12-30 DIAGNOSIS — N898 Other specified noninflammatory disorders of vagina: Secondary | ICD-10-CM

## 2020-12-30 DIAGNOSIS — B9689 Other specified bacterial agents as the cause of diseases classified elsewhere: Secondary | ICD-10-CM | POA: Insufficient documentation

## 2020-12-30 DIAGNOSIS — N760 Acute vaginitis: Secondary | ICD-10-CM | POA: Diagnosis not present

## 2020-12-30 NOTE — Progress Notes (Addendum)
   NURSE VISIT- UTI SYMPTOMS   SUBJECTIVE:  Linda Schmidt is a 40 y.o. G61P1011 female here for UTI symptoms. She is a GYN patient. She reports lower abdominal pain, urinary urgency and burning.  OBJECTIVE:  There were no vitals taken for this visit.  Appears well, in no apparent distress  No results found for this or any previous visit (from the past 24 hour(s)).  ASSESSMENT: GYN patient with UTI symptoms and negative nitrites  PLAN: Note routed to Dr. Despina Hidden   Rx sent by provider today: No Urine culture sent Call or return to clinic prn if these symptoms worsen or fail to improve as anticipated. Follow-up: as needed     NURSE VISIT- VAGINITIS/STD/POC  SUBJECTIVE:  Linda Schmidt is a 40 y.o. G2P1011 GYN patientfemale here for a vaginal swab for vaginitis screening, STD screen.  She reports the following symptoms: discharge described as malodorous, local irritation, odor and vulvar itching for 1 month. Denies abnormal vaginal bleeding, significant pelvic pain, fever, or UTI symptoms.  OBJECTIVE:  There were no vitals taken for this visit.  Appears well, in no apparent distress  ASSESSMENT: Vaginal swab for STD screen  PLAN: Self-collected vaginal probe for Gonorrhea, Chlamydia, Trichomonas, Bacterial Vaginosis, Yeast sent to lab Treatment: to be determined once results are received Follow-up as needed if symptoms persist/worsen, or new symptoms develop  Linda Schmidt  12/30/2020 3:41 PM   Attestation of Attending Supervision of Nursing Visit Encounter: Evaluation and management procedures were performed by the nursing staff under my supervision and collaboration.  I have reviewed the nurse's note and chart, and I agree with the management and plan.  Rockne Coons MD Attending Physician for the Center for [facility] 12/30/2020 4:09 PM

## 2021-01-01 ENCOUNTER — Other Ambulatory Visit: Payer: Self-pay | Admitting: Obstetrics & Gynecology

## 2021-01-01 LAB — CERVICOVAGINAL ANCILLARY ONLY
Bacterial Vaginitis (gardnerella): POSITIVE — AB
Candida Glabrata: NEGATIVE
Candida Vaginitis: NEGATIVE
Chlamydia: NEGATIVE
Comment: NEGATIVE
Comment: NEGATIVE
Comment: NEGATIVE
Comment: NEGATIVE
Comment: NEGATIVE
Comment: NORMAL
Neisseria Gonorrhea: NEGATIVE
Trichomonas: NEGATIVE

## 2021-01-01 MED ORDER — METRONIDAZOLE 0.75 % VA GEL: VAGINAL | 0 refills | Status: DC

## 2021-01-02 ENCOUNTER — Telehealth: Payer: Self-pay | Admitting: Obstetrics & Gynecology

## 2021-01-02 LAB — URINE CULTURE: Organism ID, Bacteria: NO GROWTH

## 2021-01-02 NOTE — Telephone Encounter (Signed)
Pt called concerned about meds we ordered.  States that in the past (years ago) she had a reaction that required an ER visit. Broke out in a itchy body rash, was told it was either the sulfa drug or the Flagyl & told not to use either anymore  We ordered the metroNIDAZOLE (METROGEL VAGINAL) 0.75 % vaginal gel, states she used once before realizing it was similar to Flagyl - states no reaction as of yet & wonders if it is safe for her to continue to use  Please advise & notify pt    [facility]

## 2021-01-02 NOTE — Telephone Encounter (Signed)
Returned pt's call, no answer, left vm stating that medication should be safe to continue per Dr Forestine Chute notes and msg to pharmacy.

## 2021-02-13 ENCOUNTER — Other Ambulatory Visit: Payer: BC Managed Care – PPO | Admitting: Adult Health

## 2021-04-03 ENCOUNTER — Ambulatory Visit
Admission: EM | Admit: 2021-04-03 | Discharge: 2021-04-03 | Disposition: A | Discharge status: Home / Self Care | Payer: BC Managed Care – PPO | Attending: Emergency Medicine | Admitting: Emergency Medicine

## 2021-04-03 ENCOUNTER — Encounter: Payer: Self-pay | Admitting: Emergency Medicine

## 2021-04-03 DIAGNOSIS — M549 Dorsalgia, unspecified: Secondary | ICD-10-CM | POA: Diagnosis not present

## 2021-04-03 MED ORDER — CYCLOBENZAPRINE HCL 10 MG PO TABS: 10.0000 mg | ORAL_TABLET | Freq: Two times a day (BID) | ORAL | 0 refills | Status: DC | PRN

## 2021-04-03 MED ORDER — PREDNISONE 20 MG PO TABS: 20.0000 mg | ORAL_TABLET | Freq: Two times a day (BID) | ORAL | 0 refills | Status: AC

## 2021-04-03 NOTE — ED Triage Notes (Signed)
Pain to upper back for the past few days after stretching

## 2021-04-03 NOTE — ED Provider Notes (Signed)
[facility]   [id number] 04/03/21 Arrival Time: 1342  TD:DUKG PAIN  SUBJECTIVE: History from: patient. Linda Schmidt is a 40 y.o. female complains of upper back pain x few days.  Symptoms began while stretching.  Localizes the pain to the upper back.  Describes the pain as intermittent and sharp in character.  Has tried OTC medications without relief.  Symptoms are made worse with deep breath.  Denies similar symptoms in the past.  Denies fever, chills, erythema, ecchymosis, effusion, weakness, numbness and tingling, saddle paresthesias, loss of bowel or bladder function.      ROS: As per HPI.  All other pertinent ROS negative.     Past Medical History:  Diagnosis Date   Asthma    Past Surgical History:  Procedure Laterality Date   THERAPEUTIC ABORTION     WISDOM TOOTH EXTRACTION     Allergies  Allergen Reactions   Catfish [Fish Allergy]    Metronidazole Rash   Sulfa Antibiotics Hives   No current facility-administered medications on file prior to encounter.   Current Outpatient Medications on File Prior to Encounter  Medication Sig Dispense Refill   acetaminophen (TYLENOL) 500 MG tablet Take 1 tablet (500 mg total) by mouth every 6 (six) hours as needed. 30 tablet 0   albuterol (VENTOLIN HFA) 108 (90 Base) MCG/ACT inhaler Inhale 1-2 puffs into the lungs every 6 (six) hours as needed for wheezing or shortness of breath. 18 g 0   ibuprofen (ADVIL,MOTRIN) 600 MG tablet Take 1 tablet (600 mg total) by mouth every 6 (six) hours as needed. 30 tablet 0   Magnesium-Zinc 133.33-5 MG TABS Take 1 tablet by mouth daily.      metroNIDAZOLE (METROGEL VAGINAL) 0.75 % vaginal gel Nightly x 5 nights 70 g 0   omeprazole (PRILOSEC) 20 MG capsule Take 20 mg daily by mouth.     pramoxine-hydrocortisone (ANALPRAM HC) cream Apply topically 3 (three) times daily. 30 g 1   Social History   Socioeconomic History   Marital status: Single    Spouse name: Not on file   Number of children:  Not on file   Years of education: Not on file   Highest education level: Not on file  Occupational History   Not on file  Tobacco Use   Smoking status: Never   Smokeless tobacco: Never  Vaping Use   Vaping Use: Never used  Substance and Sexual Activity   Alcohol use: Yes    Comment: 4 times weekly    Drug use: No   Sexual activity: Not Currently    Birth control/protection: None  Other Topics Concern   Not on file  Social History Narrative   Not on file   Social Determinants of Health   Financial Resource Strain: Not on file  Food Insecurity: Not on file  Transportation Needs: Not on file  Physical Activity: Not on file  Stress: Not on file  Social Connections: Not on file  Intimate Partner Violence: Not on file   Family History  Problem Relation Age of Onset   COPD Father    Emphysema Father    Hypertension Father    Hypertension Mother    Stroke Mother    Multiple sclerosis Mother    COPD Mother     OBJECTIVE:  Vitals:   04/03/21 1459  BP: (!) 138/96  Pulse: (!) 111  Resp: 18  Temp: 98.7 F (37.1 C)  TempSrc: Oral  SpO2: 97%    General appearance: ALERT;  in no acute distress.  Head: NCAT Lungs: Normal respiratory effort Musculoskeletal: Back Inspection: Skin warm, dry, clear and intact without obvious erythema, effusion, or ecchymosis.  Palpation: RT upper back over paravertebral muscles ROM: FROM active and passive Strength: 5/5 shld abduction, 5/5 shld adduction, 5/5 elbow flexion, 5/5 elbow extension, 5/5 grip strength Skin: warm and dry Neurologic: Ambulates without difficulty Psychological: alert and cooperative; normal mood and affect  ASSESSMENT & PLAN:  1. Upper back pain     @NIMG @  Meds ordered this encounter  Medications   predniSONE (DELTASONE) 20 MG tablet    Sig: Take 1 tablet (20 mg total) by mouth 2 (two) times daily with a meal for 5 days.    Dispense:  10 tablet    Refill:  0    Order Specific Question:   Supervising  Provider    Answer:   Eustace Moore   cyclobenzaprine (FLEXERIL) 10 MG tablet    Sig: Take 1 tablet (10 mg total) by mouth 2 (two) times daily as needed for muscle spasms.    Dispense:  20 tablet    Refill:  0    Order Specific Question:   Supervising Provider    Answer:   [[id number]] Eustace Moore    Continue conservative management of rest, ice, and gentle stretches Prednisone prescribed.  Take as directed Take cyclobenzaprine at nighttime for symptomatic relief. Avoid driving or operating heavy machinery while using medication. Follow up with PCP if symptoms persist Return or go to the ER if you have any new or worsening symptoms (fever, chills, chest pain, abdominal pain, changes in bowel or bladder habits, pain radiating into lower legs, etc...)   Reviewed expectations re: course of current medical issues. Questions answered. Outlined signs and symptoms indicating need for more acute intervention. Patient verbalized understanding. After Visit Summary given.     [[id number]], PA-C 04/03/21 1519

## 2021-04-03 NOTE — Discharge Instructions (Addendum)
Continue conservative management of rest, ice, and gentle stretches Prednisone prescribed.  Take as directed Take cyclobenzaprine at nighttime for symptomatic relief. Avoid driving or operating heavy machinery while using medication. Follow up with PCP if symptoms persist Return or go to the ER if you have any new or worsening symptoms (fever, chills, chest pain, abdominal pain, changes in bowel or bladder habits, pain radiating into lower legs, etc...)

## 2021-04-21 DIAGNOSIS — Z20822 Contact with and (suspected) exposure to covid-19: Secondary | ICD-10-CM | POA: Diagnosis not present

## 2021-11-18 ENCOUNTER — Telehealth: Payer: Self-pay | Admitting: Orthopedic Surgery

## 2021-11-18 NOTE — Telephone Encounter (Signed)
Patient called to inquire about possible ament with Dr Amedeo Kinsman for finger injury, due to insurance change to [employer]. Discussed requesting of Xray CD and reports for review, which she may go ahead and do; however, at this time, patient said she will try to work things out with her current orthopaedic specialist in [place]. States will call back if wishes to be seen at our clinic.

## 2021-12-01 IMAGING — DX DG CHEST 2V
2 series · 2 of 2 positions shown · non-contrast
Comparison: 11/23/2007

CLINICAL DATA: Cough and short of breath

EXAM:
CHEST - 2 VIEW

[chest pa]
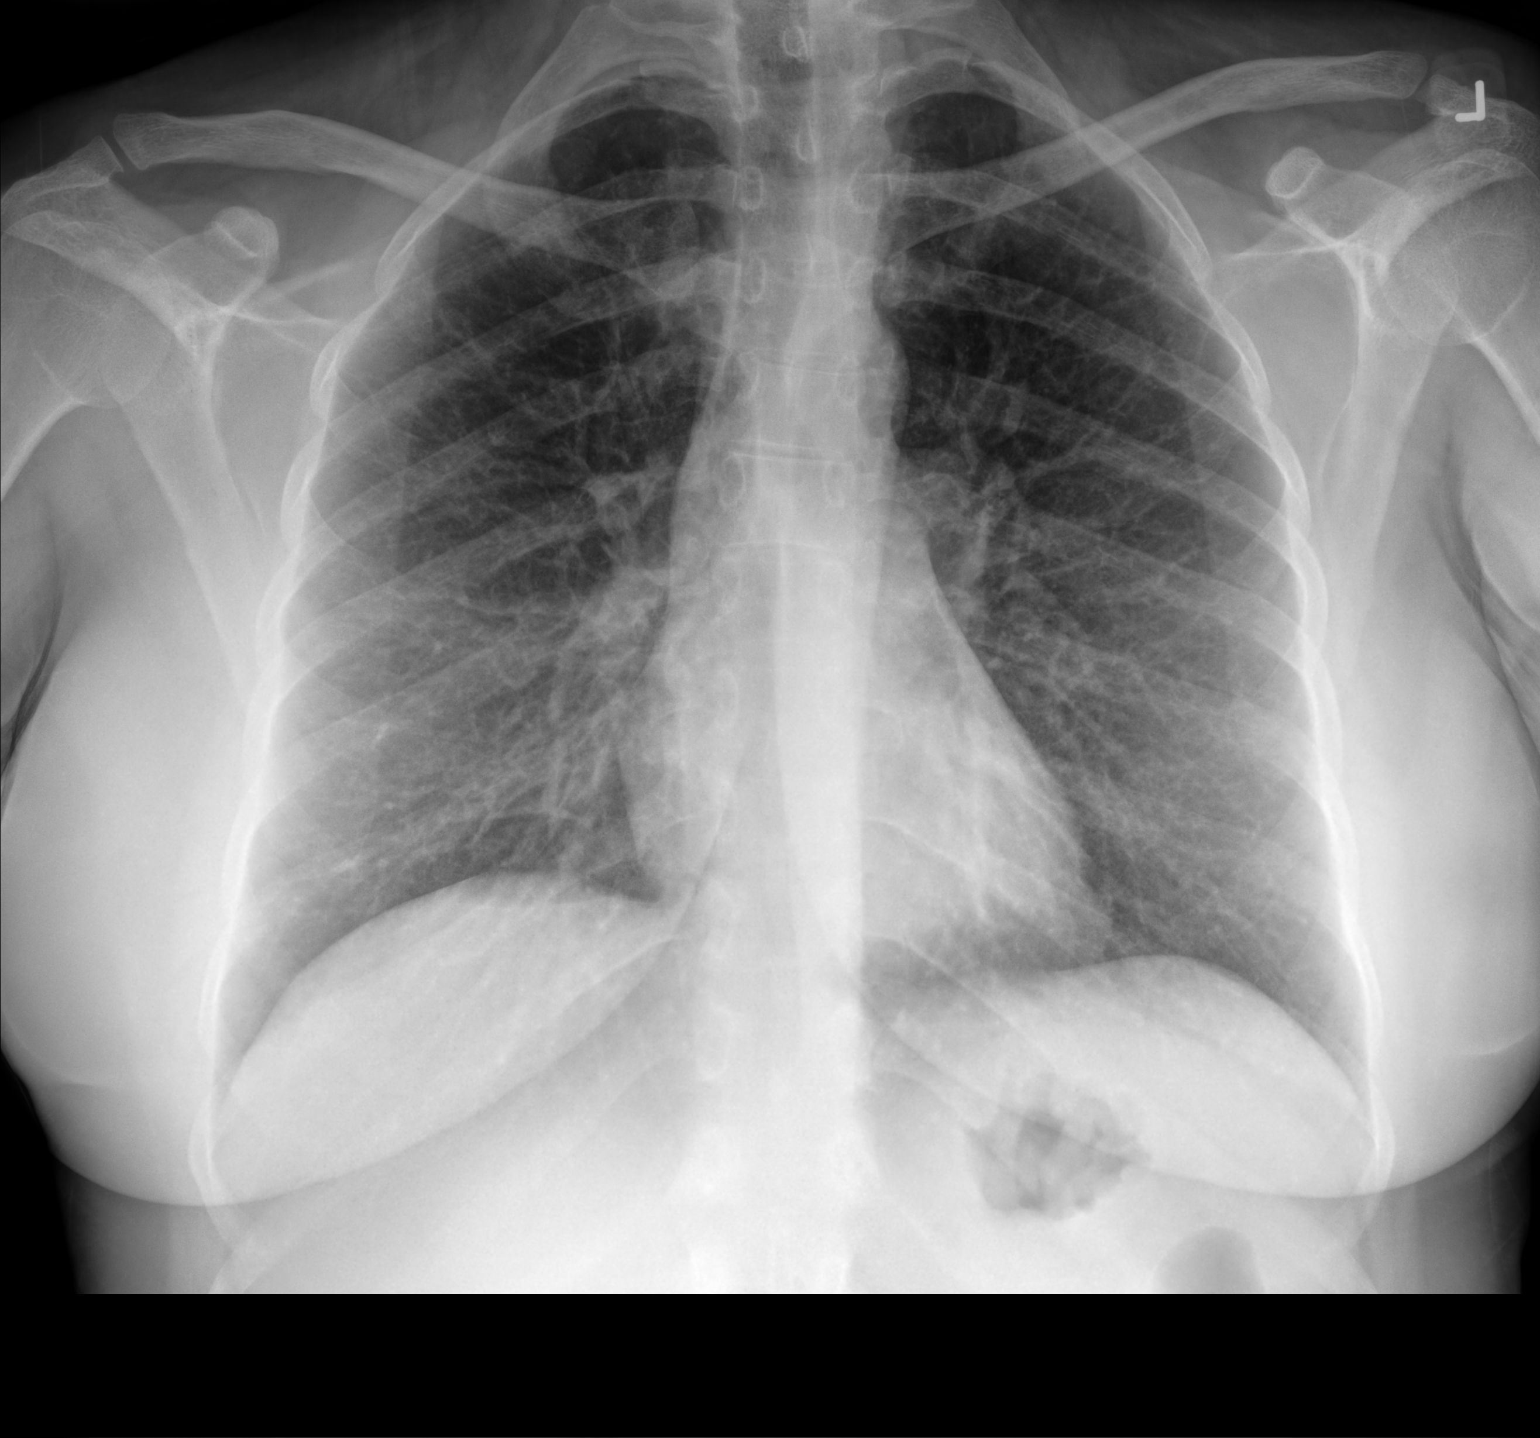

[chest lat]
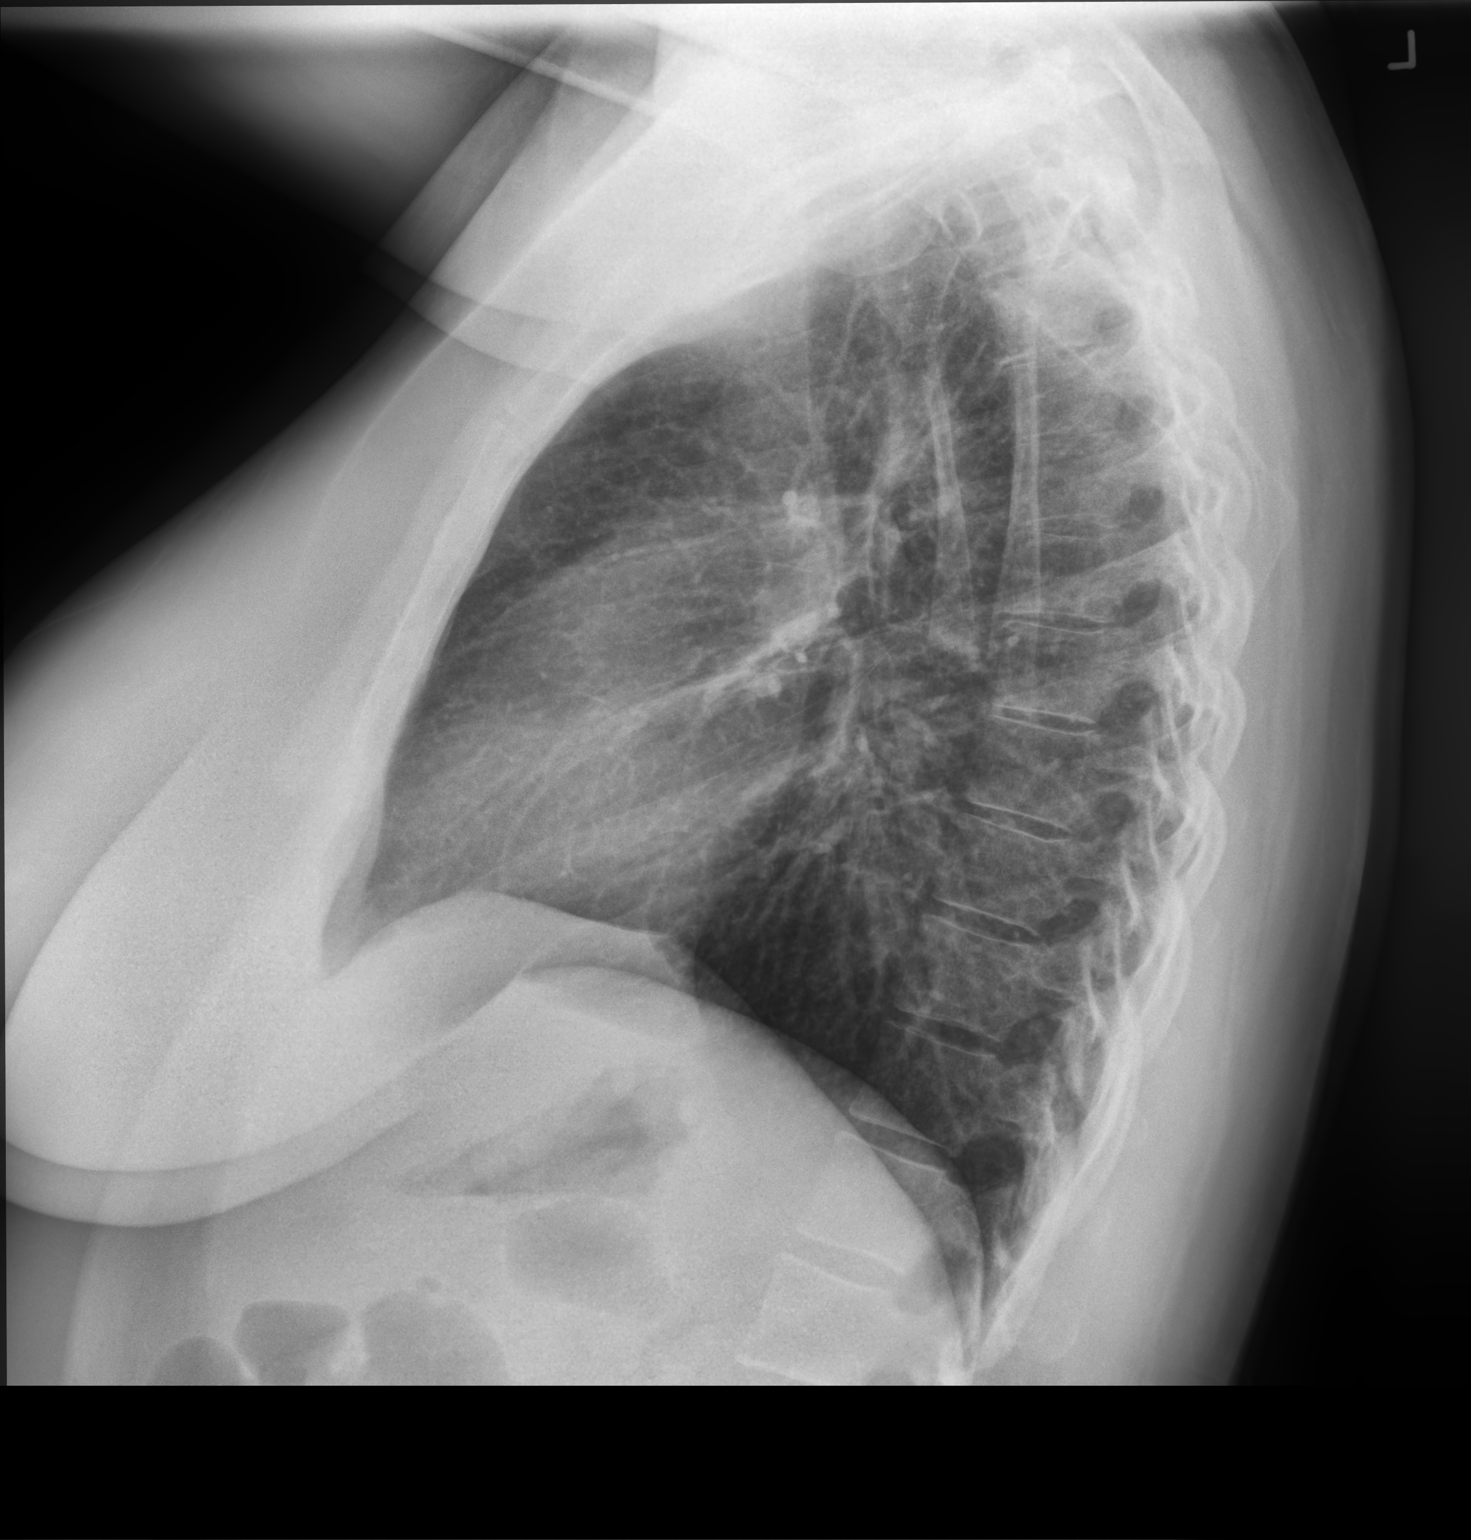

[2 of 2 positions shown; findings below may reference images not displayed]

FINDINGS: The heart size and mediastinal contours are within normal limits.
Both lungs are clear. The visualized skeletal structures are
unremarkable.
IMPRESSION: No active cardiopulmonary disease.

## 2021-12-11 ENCOUNTER — Encounter: Payer: Self-pay | Admitting: Registered Nurse

## 2021-12-11 ENCOUNTER — Ambulatory Visit (INDEPENDENT_AMBULATORY_CARE_PROVIDER_SITE_OTHER): Payer: 59 | Admitting: Registered Nurse

## 2021-12-11 VITALS — BP 118/74 | HR 101 | Temp 97.7°F | Resp 16 | Ht 67.0 in | Wt 196.8 lb

## 2021-12-11 DIAGNOSIS — Z13220 Encounter for screening for lipoid disorders: Secondary | ICD-10-CM

## 2021-12-11 DIAGNOSIS — R9431 Abnormal electrocardiogram [ECG] [EKG]: Secondary | ICD-10-CM

## 2021-12-11 DIAGNOSIS — Z1329 Encounter for screening for other suspected endocrine disorder: Secondary | ICD-10-CM | POA: Diagnosis not present

## 2021-12-11 DIAGNOSIS — R55 Syncope and collapse: Secondary | ICD-10-CM | POA: Diagnosis not present

## 2021-12-11 DIAGNOSIS — Z130 Encounter for screening for diseases of the blood and blood-forming organs and certain disorders involving the immune mechanism: Secondary | ICD-10-CM

## 2021-12-11 DIAGNOSIS — Z13228 Encounter for screening for other metabolic disorders: Secondary | ICD-10-CM | POA: Diagnosis not present

## 2021-12-11 DIAGNOSIS — H6690 Otitis media, unspecified, unspecified ear: Secondary | ICD-10-CM

## 2021-12-11 DIAGNOSIS — J4520 Mild intermittent asthma, uncomplicated: Secondary | ICD-10-CM | POA: Diagnosis not present

## 2021-12-11 LAB — CBC WITH DIFFERENTIAL/PLATELET
Basophils Absolute: 0.1 10*3/uL (ref 0.0–0.1)
Basophils Relative: 1.3 % (ref 0.0–3.0)
Eosinophils Absolute: 0.2 10*3/uL (ref 0.0–0.7)
Eosinophils Relative: 3.5 % (ref 0.0–5.0)
HCT: 41.1 % (ref 36.0–46.0)
Hemoglobin: 13.8 g/dL (ref 12.0–15.0)
Lymphocytes Relative: 29.3 % (ref 12.0–46.0)
Lymphs Abs: 2 10*3/uL (ref 0.7–4.0)
MCHC: 33.5 g/dL (ref 30.0–36.0)
MCV: 90.1 fl (ref 78.0–100.0)
Monocytes Absolute: 0.7 10*3/uL (ref 0.1–1.0)
Monocytes Relative: 10.8 % (ref 3.0–12.0)
Neutro Abs: 3.7 10*3/uL (ref 1.4–7.7)
Neutrophils Relative %: 55.1 % (ref 43.0–77.0)
Platelets: 381 10*3/uL (ref 150.0–400.0)
RBC: 4.56 Mil/uL (ref 3.87–5.11)
RDW: 13.8 % (ref 11.5–15.5)
WBC: 6.7 10*3/uL (ref 4.0–10.5)

## 2021-12-11 LAB — COMPREHENSIVE METABOLIC PANEL
ALT: 22 U/L (ref 0–35)
AST: 22 U/L (ref 0–37)
Albumin: 4.3 g/dL (ref 3.5–5.2)
Alkaline Phosphatase: 90 U/L (ref 39–117)
BUN: 9 mg/dL (ref 6–23)
CO2: 26 mEq/L (ref 19–32)
Calcium: 9.3 mg/dL (ref 8.4–10.5)
Chloride: 104 mEq/L (ref 96–112)
Creatinine, Ser: 0.75 mg/dL (ref 0.40–1.20)
GFR: 98.59 mL/min (ref >60.00)
Glucose, Bld: 90 mg/dL (ref 70–99)
Potassium: 4.3 mEq/L (ref 3.5–5.1)
Sodium: 138 mEq/L (ref 135–145)
Total Bilirubin: 0.2 mg/dL (ref 0.2–1.2)
Total Protein: 6.8 g/dL (ref 6.0–8.3)

## 2021-12-11 LAB — LIPID PANEL
Cholesterol: 160 mg/dL (ref 0–200)
HDL: 80.9 mg/dL (ref >39.00)
LDL Cholesterol: 64 mg/dL (ref 0–99)
NonHDL: 79.12
Total CHOL/HDL Ratio: 2
Triglycerides: 78 mg/dL (ref 0.0–149.0)
VLDL: 15.6 mg/dL (ref 0.0–40.0)

## 2021-12-11 LAB — URINALYSIS
Bilirubin Urine: NEGATIVE
Hgb urine dipstick: NEGATIVE
Ketones, ur: NEGATIVE
Leukocytes,Ua: NEGATIVE
Nitrite: NEGATIVE
Specific Gravity, Urine: >=1.03 — AB (ref 1.000–1.030)
Total Protein, Urine: NEGATIVE
Urine Glucose: NEGATIVE
Urobilinogen, UA: 0.2 (ref 0.0–1.0)
pH: 5.5 (ref 5.0–8.0)

## 2021-12-11 LAB — TSH: TSH: 2.38 u[IU]/mL (ref 0.35–5.50)

## 2021-12-11 LAB — HEMOGLOBIN A1C: Hgb A1c MFr Bld: 5.4 % (ref 4.6–6.5)

## 2021-12-11 MED ORDER — PREDNISONE 10 MG (21) PO TBPK: ORAL_TABLET | ORAL | 0 refills | Status: DC

## 2021-12-11 MED ORDER — AMOXICILLIN-POT CLAVULANATE 875-125 MG PO TABS: 1.0000 | ORAL_TABLET | Freq: Two times a day (BID) | ORAL | 0 refills | Status: DC

## 2021-12-11 MED ORDER — AZELASTINE HCL 0.1 % NA SOLN: 1.0000 | Freq: Two times a day (BID) | NASAL | 12 refills | Status: AC

## 2021-12-11 MED ORDER — ALBUTEROL SULFATE HFA 108 (90 BASE) MCG/ACT IN AERS: 1.0000 | INHALATION_SPRAY | Freq: Four times a day (QID) | RESPIRATORY_TRACT | 0 refills | Status: AC | PRN

## 2021-12-11 NOTE — Progress Notes (Signed)
New Patient Office Visit  Subjective:  Patient ID: Linda Schmidt, female    DOB: [birth year]  Age: 41 y.o. MRN: [id number]  CC:  Chief Complaint  Patient presents with   New Patient (Initial Visit)    Pt reports has not had PCP in several years, [facility] in [place] , [place] family tree Linda Schmidt, pt notes ear infection in Rt    Ear Pain    Pt notes Eart infection in Rt notes shooting pains, headaches that side of head, fatigue, wanted to get to the cause of this, notes lost consciousness on Monday or tuesday   vaginal irritation    Pt notes irritation to vaginal area post abx, notes she is prone to yeast post abx     HPI [name] presents for visit to est care.  Ear pain Ongoing pain x 1 mo. Pain, stabbing, tinnitus.  Did have abx last mo. Did not work out.  Some r side pressure in head   Vaginal discharge/irritation Ongoing since abx use last mo Prone to candidal infections.   Syncopal Episode A few mo of fatigue and weakness.  Worst started in Nov Intermittently fasts for health Notes symptoms onset with tunnel vision, sweating.  Notes she did have an instance of falling, getting up, and then collapsing back to the floor.  Has had some dizziness since but overall stable since isolated episode.   Past Medical History:  Diagnosis Date   Asthma     Past Surgical History:  Procedure Laterality Date   THERAPEUTIC ABORTION     WISDOM TOOTH EXTRACTION      Family History  Problem Relation Age of Onset   COPD Father    Emphysema Father    Hypertension Father    Hypertension Mother    Stroke Mother    Multiple sclerosis Mother    COPD Mother     Social History   Socioeconomic History   Marital status: Single    Spouse name: Not on file   Number of children: 1   Years of education: Not on file   Highest education level: Not on file  Occupational History   Not on file  Tobacco Use   Smoking status: Never   Smokeless tobacco: Never  Vaping Use    Vaping Use: Never used  Substance and Sexual Activity   Alcohol use: Yes    Comment: twice / mo   Drug use: No   Sexual activity: Not Currently    Birth control/protection: None  Other Topics Concern   Not on file  Social History Narrative   Not on file   Social Determinants of Health   Financial Resource Strain: Not on file  Food Insecurity: Not on file  Transportation Needs: Not on file  Physical Activity: Not on file  Stress: Not on file  Social Connections: Not on file  Intimate Partner Violence: Not on file    ROS Review of Systems  Constitutional: Negative.   HENT: Negative.    Eyes: Negative.   Respiratory: Negative.    Cardiovascular: Negative.   Gastrointestinal: Negative.   Genitourinary: Negative.   Musculoskeletal: Negative.   Skin: Negative.   Neurological: Negative.   Psychiatric/Behavioral: Negative.    All other systems reviewed and are negative.  Objective:   Today's Vitals: BP 118/74   Pulse (!) 101   Temp 97.7 F (36.5 C) (Temporal)   Resp 16   Ht 5\' 7"  (1.702 m)   Wt 196 lb 12.8 oz (  89.3 kg)   SpO2 97%   BMI 30.82 kg/m   Physical Exam Vitals and nursing note reviewed.  Constitutional:      General: She is not in acute distress.    Appearance: Normal appearance. She is not ill-appearing, toxic-appearing or diaphoretic.  HENT:     Right Ear: Hearing, ear canal and external ear normal. A middle ear effusion is present. Tympanic membrane is bulging.     Left Ear: Hearing, tympanic membrane, ear canal and external ear normal.  Cardiovascular:     Rate and Rhythm: Normal rate and regular rhythm.     Pulses: Normal pulses.     Heart sounds: Normal heart sounds. No murmur heard.   No friction rub. No gallop.  Pulmonary:     Effort: Pulmonary effort is normal. No respiratory distress.     Breath sounds: Normal breath sounds. No stridor. No wheezing, rhonchi or rales.  Chest:     Chest wall: No tenderness.  Genitourinary:    Comments:  Deferred by pt Skin:    General: Skin is warm and dry.     Capillary Refill: Capillary refill takes less than 2 seconds.  Neurological:     General: No focal deficit present.     Mental Status: She is alert and oriented to person, place, and time. Mental status is at baseline.     GCS: GCS eye subscore is 4. GCS verbal subscore is 5. GCS motor subscore is 6.     Cranial Nerves: Cranial nerves 2-12 are intact.     Sensory: Sensation is intact.     Motor: Motor function is intact.     Coordination: Coordination is intact.  Psychiatric:        Mood and Affect: Mood normal.        Behavior: Behavior normal.        Thought Content: Thought content normal.        Judgment: Judgment normal.    Assessment & Plan:   Problem List Items Addressed This Visit   None Visit Diagnoses     Mild intermittent asthma without complication    -  Primary   Relevant Medications   albuterol (VENTOLIN HFA) 108 (90 Base) MCG/ACT inhaler   predniSONE (STERAPRED UNI-PAK 21 TAB) 10 MG (21) TBPK tablet   Syncope and collapse       Relevant Orders   EKG 12-Lead (Completed)   CBC with Differential/Platelet   Comprehensive metabolic panel   Hemoglobin A1c   Lipid panel   TSH   Urinalysis   Screening for endocrine, metabolic and immunity disorder       Relevant Orders   CBC with Differential/Platelet   Comprehensive metabolic panel   Hemoglobin A1c   TSH   Urinalysis   Lipid screening       Relevant Orders   Lipid panel   Acute otitis media, unspecified otitis media type       Relevant Medications   amoxicillin-clavulanate (AUGMENTIN) 875-125 MG tablet   predniSONE (STERAPRED UNI-PAK 21 TAB) 10 MG (21) TBPK tablet   azelastine (ASTELIN) 0.1 % nasal spray   Abnormal EKG       Relevant Orders   Ambulatory referral to Cardiology       Outpatient Encounter Medications as of 12/11/2021  Medication Sig   amoxicillin-clavulanate (AUGMENTIN) 875-125 MG tablet Take 1 tablet by mouth 2 (two) times  daily.   azelastine (ASTELIN) 0.1 % nasal spray Place 1 spray into both nostrils 2 (two) times  daily. Use in each nostril as directed   famotidine (PEPCID) 20 mg Inject 20 mg into the vein every 12 (twelve) hours.   Magnesium-Zinc 133.33-5 MG TABS Take 1 tablet by mouth daily.    predniSONE (STERAPRED UNI-PAK 21 TAB) 10 MG (21) TBPK tablet Take per package instructions. Do not skip doses. Finish entire supply.   [DISCONTINUED] albuterol (VENTOLIN HFA) 108 (90 Base) MCG/ACT inhaler Inhale 1-2 puffs into the lungs every 6 (six) hours as needed for wheezing or shortness of breath.   albuterol (VENTOLIN HFA) 108 (90 Base) MCG/ACT inhaler Inhale 1-2 puffs into the lungs every 6 (six) hours as needed for wheezing or shortness of breath.   [DISCONTINUED] acetaminophen (TYLENOL) 500 MG tablet Take 1 tablet (500 mg total) by mouth every 6 (six) hours as needed. (Patient not taking: Reported on 12/11/2021)   [DISCONTINUED] cyclobenzaprine (FLEXERIL) 10 MG tablet Take 1 tablet (10 mg total) by mouth 2 (two) times daily as needed for muscle spasms. (Patient not taking: Reported on 12/11/2021)   [DISCONTINUED] ibuprofen (ADVIL,MOTRIN) 600 MG tablet Take 1 tablet (600 mg total) by mouth every 6 (six) hours as needed. (Patient not taking: Reported on 12/11/2021)   [DISCONTINUED] metroNIDAZOLE (METROGEL VAGINAL) 0.75 % vaginal gel Nightly x 5 nights (Patient not taking: Reported on 12/11/2021)   [DISCONTINUED] omeprazole (PRILOSEC) 20 MG capsule Take 20 mg daily by mouth. (Patient not taking: Reported on 12/11/2021)   [DISCONTINUED] pramoxine-hydrocortisone (ANALPRAM HC) cream Apply topically 3 (three) times daily. (Patient not taking: Reported on 12/11/2021)   No facility-administered encounter medications on file as of 12/11/2021.    Follow-up: Return in about 3 months (around 03/13/2022) for CPE, repeat labs.   PLAN Neuro exam grossly intact. No concerns on CV and resp exam.  Labs collected. Will follow up with the  patient as warranted. EKG obtained. No comparison available. Short PR noted. Otherwise RRR. Rate 89. Perhaps some mild ST changes indicating ischemia but nothing acute  Return in 3 mo for CPE and labs Patient encouraged to call clinic with any questions, comments, or concerns.  Janeece Agee, NP

## 2021-12-11 NOTE — Patient Instructions (Signed)
Ms. Welcher -  ? ?Great to meet you!  ? ?I know - a lot of moving pieces today. ? ?In brief: ? ?Ear Pain ?Augmentin twice daily for ten days. ?Prednisone per package instructions ?Azelastine nasal spray 1-2 times daily as needed.  ? ?Vaginitis ?Diflucan once every 3 days until resolved. If persistent for more than a week, let me know. ? ?Passing out ?Ekg nothing acutely concerning, but let's get a consult with cardiology to be sure. ? ?Labs ?Should be back tomorrow, I'll keep you in the loop! ? ? ?Thanks, ? ?Rich  ?

## 2021-12-12 ENCOUNTER — Other Ambulatory Visit: Payer: Self-pay | Admitting: Registered Nurse

## 2021-12-12 ENCOUNTER — Telehealth: Payer: Self-pay | Admitting: Registered Nurse

## 2021-12-12 DIAGNOSIS — J4520 Mild intermittent asthma, uncomplicated: Secondary | ICD-10-CM

## 2021-12-12 MED ORDER — FLUTICASONE PROPIONATE HFA 110 MCG/ACT IN AERO: 2.0000 | INHALATION_SPRAY | Freq: Every day | RESPIRATORY_TRACT | 12 refills | Status: DC

## 2021-12-12 NOTE — Telephone Encounter (Signed)
Have sent flovent in place ? ?Thanks, ? ?Rich

## 2021-12-12 NOTE — Telephone Encounter (Signed)
Pt called in stating that the nasal spray and albuterol isn't covered with her insurance she wanted to know if something else can be sent in, she also states that the diflucan wasn't sent in. ? ?Please advise pt uses [facility] in [place]  ?

## 2021-12-15 ENCOUNTER — Other Ambulatory Visit: Payer: Self-pay | Admitting: Registered Nurse

## 2021-12-15 ENCOUNTER — Telehealth: Payer: Self-pay | Admitting: Registered Nurse

## 2021-12-15 NOTE — Telephone Encounter (Signed)
Reason for Call Request to change medication order ?Initial Comment Caller is calling in regards to a prescription that ?was sent over by Dr. Kateri Plummer for a mutual patient. ?Additional Comment Insurance won't cover the generic and the brand is ?$275 co-pay. Caller was wondering if the doctor ?wanted to see about switching the medication or ?prescribing a different one. ?Pharmacy Name [facility] ?Pharmacist Name April Duggins ?Pharmacy Number [phone] ?Translation No ?Disp. Time (Eastern ?Time) Disposition Final User ?12/12/2021 5:19:39 PM Pharmacy Call Gwenyth Ober, RN, [place] ?Reason: Spoke to April from ?[facility]. As per April, ?the patient's insurance will not cover ?the generic version for Flovent ?HFA. Brand version is $275. April ?was unable to reach the patient. ?Advised April to call us back if ?patient is not okay with brand co-pay ?whenever she is able to reach patient ?or whenever patient goes and picks ?up her prescription. April verbalized ?understanding. ?12/12/2021 5:19:44 PM Clinical Call Yes Gwenyth Ober, RN, Dagoberto ?

## 2021-12-15 NOTE — Telephone Encounter (Signed)
Patient states that's the medication sent in was too high in cost  ?

## 2021-12-15 NOTE — Telephone Encounter (Signed)
Recommend goodrx for albuterol, should make it affordable ? ?Thanks ? ?Rich

## 2021-12-19 NOTE — Telephone Encounter (Signed)
attempted to call patient mailbox is full ?

## 2021-12-23 DIAGNOSIS — R55 Syncope and collapse: Secondary | ICD-10-CM | POA: Insufficient documentation

## 2021-12-23 DIAGNOSIS — R002 Palpitations: Secondary | ICD-10-CM | POA: Insufficient documentation

## 2021-12-23 NOTE — Progress Notes (Deleted)
?  ?Cardiology Office Note ? ? ?Date:  12/23/2021  ? ?ID:  Linda Schmidt, DOB 1981-08-12, MRN [id number] ? ?PCP:  Janeece Agee, NP  ?Cardiologist:   None ?Referring:  *** ? ?No chief complaint on file. ? ? ?  ?History of Present Illness: ?Linda Schmidt is a 41 y.o. female who is referred by *** for evaluation of syncope.  ***   ? ? ?Past Medical History:  ?Diagnosis Date  ? Asthma   ? ? ?Past Surgical History:  ?Procedure Laterality Date  ? THERAPEUTIC ABORTION    ? WISDOM TOOTH EXTRACTION    ? ? ? ?Current Outpatient Medications  ?Medication Sig Dispense Refill  ? albuterol (VENTOLIN HFA) 108 (90 Base) MCG/ACT inhaler Inhale 1-2 puffs into the lungs every 6 (six) hours as needed for wheezing or shortness of breath. 18 g 0  ? amoxicillin-clavulanate (AUGMENTIN) 875-125 MG tablet Take 1 tablet by mouth 2 (two) times daily. 20 tablet 0  ? azelastine (ASTELIN) 0.1 % nasal spray Place 1 spray into both nostrils 2 (two) times daily. Use in each nostril as directed 30 mL 12  ? famotidine (PEPCID) 20 mg Inject 20 mg into the vein every 12 (twelve) hours.    ? Magnesium-Zinc 133.33-5 MG TABS Take 1 tablet by mouth daily.     ? predniSONE (STERAPRED UNI-PAK 21 TAB) 10 MG (21) TBPK tablet Take per package instructions. Do not skip doses. Finish entire supply. 1 each 0  ? ?No current facility-administered medications for this visit.  ? ? ?Allergies:   Catfish [fish allergy], Metronidazole, and Sulfa antibiotics  ? ? ?Social History:  The patient  reports that she has never smoked. She has never used smokeless tobacco. She reports current alcohol use. She reports that she does not use drugs.  ? ?Family History:  The patient's ***family history includes COPD in her father and mother; Emphysema in her father; Hypertension in her father and mother; Multiple sclerosis in her mother; Stroke in her mother.  ? ? ?ROS:  Please see the history of present illness.   Otherwise, review of systems are positive for {NONE DEFAULTED:18576}.    All other systems are reviewed and negative.  ? ? ?PHYSICAL EXAM: ?VS:  There were no vitals taken for this visit. , BMI There is no height or weight on file to calculate BMI. ?GENERAL:  Well appearing ?HEENT:  Pupils equal round and reactive, fundi not visualized, oral mucosa unremarkable ?NECK:  No jugular venous distention, waveform within normal limits, carotid upstroke brisk and symmetric, no bruits, no thyromegaly ?LYMPHATICS:  No cervical, inguinal adenopathy ?LUNGS:  Clear to auscultation bilaterally ?BACK:  No CVA tenderness ?CHEST:  Unremarkable ?HEART:  PMI not displaced or sustained,S1 and S2 within normal limits, no S3, no S4, no clicks, no rubs, *** murmurs ?ABD:  Flat, positive bowel sounds normal in frequency in pitch, no bruits, no rebound, no guarding, no midline pulsatile mass, no hepatomegaly, no splenomegaly ?EXT:  2 plus pulses throughout, no edema, no cyanosis no clubbing ?SKIN:  No rashes no nodules ?NEURO:  Cranial nerves II through XII grossly intact, motor grossly intact throughout ?PSYCH:  Cognitively intact, oriented to person place and time ? ? ? ?EKG:  EKG {ACTION; IS/IS [id number]} ordered today. ?The ekg ordered today demonstrates *** ? ? ?Recent Labs: ?12/11/2021: ALT 22; BUN 9; Creatinine, Ser 0.75; Hemoglobin 13.8; Platelets 381.0; Potassium 4.3; Sodium 138; TSH 2.38  ? ? ?Lipid Panel ?   ?Component Value Date/Time  ?  CHOL 160 12/11/2021 1357  ? TRIG 78.0 12/11/2021 1357  ? HDL 80.90 12/11/2021 1357  ? CHOLHDL 2 12/11/2021 1357  ? VLDL 15.6 12/11/2021 1357  ? LDLCALC 64 12/11/2021 1357  ? ?  ? ?Wt Readings from Last 3 Encounters:  ?12/11/21 196 lb 12.8 oz (89.3 kg)  ?12/14/19 200 lb (90.7 kg)  ?12/04/19 202 lb (91.6 kg)  ?  ? ? ?Other studies Reviewed: ?Additional studies/ records that were reviewed today include: ***. ?Review of the above records demonstrates:  Please see elsewhere in the note.  *** ? ? ?ASSESSMENT AND PLAN: ? ?Syncope:  *** ? ?Palpitations:   ***  ? ?Current  medicines are reviewed at length with the patient today.  The patient {ACTIONS; HAS/DOES NOT HAVE:19233} concerns regarding medicines. ? ?The following changes have been made:  {PLAN; NO CHANGE:13088:s} ? ?Labs/ tests ordered today include: *** ?No orders of the defined types were placed in this encounter. ? ? ? ?Disposition:   FU with ***  ? ? ?Signed, ?Rollene Rotunda, MD  ?12/23/2021 9:12 PM    ?[facility] ? ? ? ?

## 2021-12-24 ENCOUNTER — Ambulatory Visit: Payer: 59 | Admitting: Cardiology

## 2021-12-24 ENCOUNTER — Other Ambulatory Visit: Payer: BC Managed Care – PPO | Admitting: Advanced Practice Midwife

## 2021-12-24 DIAGNOSIS — R55 Syncope and collapse: Secondary | ICD-10-CM

## 2021-12-24 DIAGNOSIS — R002 Palpitations: Secondary | ICD-10-CM

## 2021-12-25 ENCOUNTER — Other Ambulatory Visit: Payer: BC Managed Care – PPO | Admitting: Adult Health

## 2022-03-03 NOTE — Progress Notes (Deleted)
Cardiology Office Note   Date:  03/03/2022   ID:  Linda Schmidt, DOB 26-Jun-1981, MRN [id number]  PCP:  Janeece Agee, NP  Cardiologist:   None Referring:  ***  No chief complaint on file.     History of Present Illness: Linda Schmidt is a 41 y.o. female who is referred by *** for evaluation of syncope.  ***    Past Medical History:  Diagnosis Date   Asthma     Past Surgical History:  Procedure Laterality Date   THERAPEUTIC ABORTION     WISDOM TOOTH EXTRACTION       Current Outpatient Medications  Medication Sig Dispense Refill   albuterol (VENTOLIN HFA) 108 (90 Base) MCG/ACT inhaler Inhale 1-2 puffs into the lungs every 6 (six) hours as needed for wheezing or shortness of breath. 18 g 0   azelastine (ASTELIN) 0.1 % nasal spray Place 1 spray into both nostrils 2 (two) times daily. Use in each nostril as directed 30 mL 12   famotidine (PEPCID) 20 mg Inject 20 mg into the vein every 12 (twelve) hours.     Magnesium-Zinc 133.33-5 MG TABS Take 1 tablet by mouth daily.      No current facility-administered medications for this visit.    Allergies:   Catfish [fish allergy], Metronidazole, and Sulfa antibiotics    Social History:  The patient  reports that she has never smoked. She has never used smokeless tobacco. She reports current alcohol use. She reports that she does not use drugs.   Family History:  The patient's ***family history includes COPD in her father and mother; Emphysema in her father; Hypertension in her father and mother; Multiple sclerosis in her mother; Stroke in her mother.    ROS:  Please see the history of present illness.   Otherwise, review of systems are positive for {NONE DEFAULTED:18576}.   All other systems are reviewed and negative.    PHYSICAL EXAM: VS:  There were no vitals taken for this visit. , BMI There is no height or weight on file to calculate BMI. GENERAL:  Well appearing HEENT:  Pupils equal round and reactive, fundi not  visualized, oral mucosa unremarkable NECK:  No jugular venous distention, waveform within normal limits, carotid upstroke brisk and symmetric, no bruits, no thyromegaly LYMPHATICS:  No cervical, inguinal adenopathy LUNGS:  Clear to auscultation bilaterally BACK:  No CVA tenderness CHEST:  Unremarkable HEART:  PMI not displaced or sustained,S1 and S2 within normal limits, no S3, no S4, no clicks, no rubs, *** murmurs ABD:  Flat, positive bowel sounds normal in frequency in pitch, no bruits, no rebound, no guarding, no midline pulsatile mass, no hepatomegaly, no splenomegaly EXT:  2 plus pulses throughout, no edema, no cyanosis no clubbing SKIN:  No rashes no nodules NEURO:  Cranial nerves II through XII grossly intact, motor grossly intact throughout PSYCH:  Cognitively intact, oriented to person place and time    EKG:  EKG {ACTION; IS/IS [id number]} ordered today. The ekg ordered today demonstrates ***   Recent Labs: 12/11/2021: ALT 22; BUN 9; Creatinine, Ser 0.75; Hemoglobin 13.8; Platelets 381.0; Potassium 4.3; Sodium 138; TSH 2.38    Lipid Panel    Component Value Date/Time   CHOL 160 12/11/2021 1357   TRIG 78.0 12/11/2021 1357   HDL 80.90 12/11/2021 1357   CHOLHDL 2 12/11/2021 1357   VLDL 15.6 12/11/2021 1357   LDLCALC 64 12/11/2021 1357      Wt Readings from Last 3 Encounters:  12/11/21 196 lb 12.8 oz (89.3 kg)  12/14/19 200 lb (90.7 kg)  12/04/19 202 lb (91.6 kg)      Other studies Reviewed: Additional studies/ records that were reviewed today include: ***. Review of the above records demonstrates:  Please see elsewhere in the note.  ***   ASSESSMENT AND PLAN:  Syncope:  ***   Current medicines are reviewed at length with the patient today.  The patient {ACTIONS; HAS/DOES NOT HAVE:19233} concerns regarding medicines.  The following changes have been made:  {PLAN; NO CHANGE:13088:s}  Labs/ tests ordered today include: *** No orders of the defined types  were placed in this encounter.    Disposition:   FU with ***    Signed, Rollene Rotunda, MD  03/03/2022 8:00 PM    [facility]

## 2022-03-04 ENCOUNTER — Ambulatory Visit: Payer: 59 | Admitting: Cardiology

## 2022-03-04 DIAGNOSIS — R55 Syncope and collapse: Secondary | ICD-10-CM

## 2022-03-16 ENCOUNTER — Encounter: Payer: 59 | Admitting: Registered Nurse

## 2022-03-24 ENCOUNTER — Encounter: Payer: 59 | Admitting: Registered Nurse

## 2022-05-25 ENCOUNTER — Encounter (HOSPITAL_COMMUNITY): Payer: Self-pay

## 2022-05-25 ENCOUNTER — Other Ambulatory Visit: Payer: Self-pay

## 2022-05-25 ENCOUNTER — Emergency Department (HOSPITAL_COMMUNITY)
Admission: EM | Admit: 2022-05-25 | Discharge: 2022-05-25 | Disposition: A | Discharge status: Home / Self Care | Payer: 59 | Attending: Emergency Medicine | Admitting: Emergency Medicine

## 2022-05-25 DIAGNOSIS — U071 COVID-19: Secondary | ICD-10-CM | POA: Diagnosis not present

## 2022-05-25 DIAGNOSIS — R509 Fever, unspecified: Secondary | ICD-10-CM | POA: Diagnosis present

## 2022-05-25 LAB — RESP PANEL BY RT-PCR (FLU A&B, COVID) ARPGX2
Influenza A by PCR: NEGATIVE
Influenza B by PCR: NEGATIVE
SARS Coronavirus 2 by RT PCR: POSITIVE — AB

## 2022-05-25 MED ORDER — ALBUTEROL SULFATE HFA 108 (90 BASE) MCG/ACT IN AERS
2.0000 | INHALATION_SPRAY | Freq: Once | RESPIRATORY_TRACT | Status: AC
  Administered 2022-05-25: 2 via RESPIRATORY_TRACT
  Filled 2022-05-25: qty 6.7

## 2022-05-25 MED ORDER — BENZONATATE 100 MG PO CAPS: 100.0000 mg | ORAL_CAPSULE | Freq: Three times a day (TID) | ORAL | 0 refills | Status: AC

## 2022-05-25 MED ORDER — AEROCHAMBER PLUS FLO-VU MEDIUM MISC
1.0000 | Freq: Once | Status: AC
  Administered 2022-05-25: 1
  Filled 2022-05-25 (×2): qty 1

## 2022-05-25 NOTE — ED Triage Notes (Signed)
Patient states that she has burning in chest when she coughs or takes deep breaths. States that she is having fatigue with body aches, fever, overall not feeling well since Friday.

## 2022-05-25 NOTE — ED Provider Notes (Signed)
[facility] EMERGENCY DEPARTMENT Provider Note   CSN: [id number] Arrival date & time: 05/25/22  1206     History  Chief Complaint  Patient presents with   Fever    Linda Schmidt is a 41 y.o. female.   Fever  This patient is a very pleasant 41 year old female, she states that she does have a history of reactive airway disease but does not use any medications.  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  Initially it was the myalgias which have now gone away and she is left with a residual cough burning in the chest and a sore throat.  She did not have a fever today.  No nausea vomiting or diarrhea.  She was treated for UTI last month but it completely resolved and she has no urinary symptoms at this time.    Home Medications Prior to Admission medications   Medication Sig Start Date End Date Taking? Authorizing Provider  benzonatate (TESSALON) 100 MG capsule Take 1 capsule (100 mg total) by mouth every 8 (eight) hours. 05/25/22  Yes Eber Hong, MD  albuterol (VENTOLIN HFA) 108 (90 Base) MCG/ACT inhaler Inhale 1-2 puffs into the lungs every 6 (six) hours as needed for wheezing or shortness of breath. 12/11/21   Janeece Agee, NP  azelastine (ASTELIN) 0.1 % nasal spray Place 1 spray into both nostrils 2 (two) times daily. Use in each nostril as directed 12/11/21   Janeece Agee, NP  famotidine (PEPCID) 20 mg Inject 20 mg into the vein every 12 (twelve) hours.    [provider]  Magnesium-Zinc 133.33-5 MG TABS Take 1 tablet by mouth daily.     [provider]      Allergies    Catfish [fish allergy], Metronidazole, and Sulfa antibiotics    Review of Systems   Review of Systems  Constitutional:  Positive for fever.  All other systems reviewed and are negative.   Physical Exam Updated Vital Signs BP (!) 138/99    Pulse (!) 104   Temp 98.3 F (36.8 C)   Resp 18   Ht 1.702 m (5\' 7" )   Wt 83.9 kg   SpO2 99%   BMI 28.98 kg/m  Physical Exam Vitals and nursing note reviewed.  Constitutional:      General: She is not in acute distress.    Appearance: She is well-developed.  HENT:     Head: Normocephalic and atraumatic.     Mouth/Throat:     Mouth: Mucous membranes are moist.     Pharynx: No oropharyngeal exudate or posterior oropharyngeal erythema.  Eyes:     General: No scleral icterus.       Right eye: No discharge.        Left eye: No discharge.     Conjunctiva/sclera: Conjunctivae normal.     Pupils: Pupils are equal, round, and reactive to light.  Neck:     Thyroid: No thyromegaly.     Vascular: No JVD.  Cardiovascular:     Rate and Rhythm: Normal rate and regular rhythm.     Heart sounds: Normal heart sounds. No murmur heard.    No friction rub. No gallop.  Pulmonary:     Effort: Pulmonary effort is normal. No respiratory distress.     Breath sounds: Normal breath sounds. No wheezing or rales.  Abdominal:  General: Bowel sounds are normal. There is no distension.     Palpations: Abdomen is soft. There is no mass.     Tenderness: There is no abdominal tenderness.  Musculoskeletal:        General: No tenderness. Normal range of motion.     Cervical back: Normal range of motion and neck supple.  Lymphadenopathy:     Cervical: No cervical adenopathy.  Skin:    General: Skin is warm and dry.     Findings: No erythema or rash.  Neurological:     Mental Status: She is alert.     Coordination: Coordination normal.  Psychiatric:        Behavior: Behavior normal.     ED Results / Procedures / Treatments   Labs (all labs ordered are listed, but only abnormal results are displayed) Labs Reviewed  RESP PANEL BY RT-PCR (FLU A&B, COVID) ARPGX2 - Abnormal; Notable for the following components:      Result Value   SARS Coronavirus 2 by RT PCR POSITIVE (*)    All other components  within normal limits    EKG None  Radiology No results found.  Procedures Procedures    Medications Ordered in ED Medications  albuterol (VENTOLIN HFA) 108 (90 Base) MCG/ACT inhaler 2 puff (has no administration in time range)  AeroChamber Plus Flo-Vu Medium MISC 1 each (has no administration in time range)    ED Course/ Medical Decision Making/ A&P                           Medical Decision Making Risk Prescription drug management.   This patient presents to the ED for concern of fever sore throat cough differential diagnosis includes viral syndrome, less likely to be pneumonia given the lack of pulmonary findings on exam and the correlation with upper respiratory symptoms.    Additional history obtained:  Additional history obtained from electronic medical record External records from outside source obtained and reviewed including office notes with her family doctor being treated for reactive airway disease in the past   Lab Tests:  I Ordered, and personally interpreted labs.  The pertinent results include: COVID-positive   Imaging Studies ordered:  Itching is not indicated this sounds like a viral process  Medicines ordered and prescription drug management:  I ordered medication including albuterol inhaler with an AeroChamber for subjective wheezing and coughing Reevaluation of the patient after these medicines showed that the patient improved I have reviewed the patients home medicines and have made adjustments as needed   Problem List / ED Course:  Viral syndrome, stable for discharge   Social Determinants of Health:  Well-appearing, no indication for admission, patient agreeable to follow-up in the outpatient setting           Final Clinical Impression(s) / ED Diagnoses Final diagnoses:  COVID-19    Rx / DC Orders ED Discharge Orders          Ordered    benzonatate (TESSALON) 100 MG capsule  Every 8 hours        05/25/22 1343               Eber Hong, MD 05/25/22 1343

## 2022-05-25 NOTE — Discharge Instructions (Signed)
Your testing was positive for COVID-19  You may take the albuterol inhaler as follows: Albuterol is an inhaled medication which can help you to breathe better, you should take 2 puffs every 4 hours as needed, this may cause your heart to feel like it is racing, this should be a temporary side effect.  Tessalon is a cough medication that helps reduce the amount of coughing that you are having.  You may take up to 200 mg every 8 hours as needed.  This can be used safely with most or other over-the-counter medications but talk to your pharmacist before taking anything else over-the-counter with it  If you develop a fever over 101 take either Tylenol or ibuprofen.  Please be aware that your symptoms may last for upwards of 7 to 10 days but usually it is just several days.  You will need to stay out of work for the next 3 days.  Please make sure that you are quarantining away from anybody else as this is highly contagious to others.  Thank you for allowing [place] to treat you in the emergency department today.  After reviewing your examination and potential testing that was done it appears that you are safe to go home.  I would like for you to follow-up with your doctor within the next several days, have them obtain your results and follow-up with them to review all of these tests.  If you should develop severe or worsening symptoms return to the emergency department immediately

## 2022-05-27 ENCOUNTER — Telehealth: Payer: Self-pay | Admitting: Registered Nurse

## 2022-06-02 NOTE — Telephone Encounter (Signed)
error 

## 2022-09-10 DIAGNOSIS — W19XXXA Unspecified fall, initial encounter: Secondary | ICD-10-CM | POA: Diagnosis not present

## 2022-09-10 DIAGNOSIS — S46011A Strain of muscle(s) and tendon(s) of the rotator cuff of right shoulder, initial encounter: Secondary | ICD-10-CM | POA: Diagnosis not present

## 2022-09-10 DIAGNOSIS — Y939 Activity, unspecified: Secondary | ICD-10-CM | POA: Diagnosis not present

## 2022-11-07 DIAGNOSIS — R300 Dysuria: Secondary | ICD-10-CM | POA: Diagnosis not present

## 2022-11-07 DIAGNOSIS — N3001 Acute cystitis with hematuria: Secondary | ICD-10-CM | POA: Diagnosis not present

## 2022-11-24 DIAGNOSIS — S4991XA Unspecified injury of right shoulder and upper arm, initial encounter: Secondary | ICD-10-CM | POA: Diagnosis not present

## 2022-11-26 ENCOUNTER — Encounter: Payer: Self-pay | Admitting: Radiology

## 2022-12-11 ENCOUNTER — Ambulatory Visit: Payer: Medicaid Other | Admitting: Family

## 2022-12-16 DIAGNOSIS — R079 Chest pain, unspecified: Secondary | ICD-10-CM | POA: Diagnosis not present

## 2022-12-16 DIAGNOSIS — K279 Peptic ulcer, site unspecified, unspecified as acute or chronic, without hemorrhage or perforation: Secondary | ICD-10-CM | POA: Diagnosis not present

## 2022-12-16 DIAGNOSIS — R109 Unspecified abdominal pain: Secondary | ICD-10-CM | POA: Diagnosis not present

## 2022-12-16 DIAGNOSIS — K7689 Other specified diseases of liver: Secondary | ICD-10-CM | POA: Diagnosis not present

## 2022-12-16 DIAGNOSIS — Z720 Tobacco use: Secondary | ICD-10-CM | POA: Diagnosis not present

## 2022-12-16 DIAGNOSIS — R1013 Epigastric pain: Secondary | ICD-10-CM | POA: Diagnosis not present

## 2022-12-16 DIAGNOSIS — R1012 Left upper quadrant pain: Secondary | ICD-10-CM | POA: Diagnosis not present

## 2022-12-21 DIAGNOSIS — M25511 Pain in right shoulder: Secondary | ICD-10-CM | POA: Diagnosis not present

## 2022-12-29 ENCOUNTER — Ambulatory Visit: Payer: Medicaid Other | Admitting: Family Medicine

## 2022-12-29 DIAGNOSIS — Z7689 Persons encountering health services in other specified circumstances: Secondary | ICD-10-CM

## 2023-01-11 DIAGNOSIS — J9801 Acute bronchospasm: Secondary | ICD-10-CM | POA: Diagnosis not present

## 2023-01-11 DIAGNOSIS — R52 Pain, unspecified: Secondary | ICD-10-CM | POA: Diagnosis not present

## 2023-01-11 DIAGNOSIS — J40 Bronchitis, not specified as acute or chronic: Secondary | ICD-10-CM | POA: Diagnosis not present

## 2023-01-11 DIAGNOSIS — R059 Cough, unspecified: Secondary | ICD-10-CM | POA: Diagnosis not present

## 2023-01-11 DIAGNOSIS — R051 Acute cough: Secondary | ICD-10-CM | POA: Diagnosis not present

## 2023-01-12 DIAGNOSIS — R0602 Shortness of breath: Secondary | ICD-10-CM | POA: Diagnosis not present

## 2023-01-12 DIAGNOSIS — R059 Cough, unspecified: Secondary | ICD-10-CM | POA: Diagnosis not present

## 2023-01-12 DIAGNOSIS — Z1152 Encounter for screening for COVID-19: Secondary | ICD-10-CM | POA: Diagnosis not present

## 2023-01-12 DIAGNOSIS — R042 Hemoptysis: Secondary | ICD-10-CM | POA: Diagnosis not present

## 2023-01-12 DIAGNOSIS — J40 Bronchitis, not specified as acute or chronic: Secondary | ICD-10-CM | POA: Diagnosis not present

## 2023-01-14 DIAGNOSIS — J209 Acute bronchitis, unspecified: Secondary | ICD-10-CM | POA: Diagnosis not present

## 2023-01-26 DIAGNOSIS — S90121A Contusion of right lesser toe(s) without damage to nail, initial encounter: Secondary | ICD-10-CM | POA: Diagnosis not present

## 2023-01-26 DIAGNOSIS — W228XXA Striking against or struck by other objects, initial encounter: Secondary | ICD-10-CM | POA: Diagnosis not present

## 2023-01-27 DIAGNOSIS — S90121A Contusion of right lesser toe(s) without damage to nail, initial encounter: Secondary | ICD-10-CM | POA: Diagnosis not present

## 2023-01-27 DIAGNOSIS — M2010 Hallux valgus (acquired), unspecified foot: Secondary | ICD-10-CM | POA: Diagnosis not present

## 2023-01-27 DIAGNOSIS — X58XXXA Exposure to other specified factors, initial encounter: Secondary | ICD-10-CM | POA: Diagnosis not present

## 2023-02-02 DIAGNOSIS — S90121A Contusion of right lesser toe(s) without damage to nail, initial encounter: Secondary | ICD-10-CM | POA: Diagnosis not present

## 2023-02-02 DIAGNOSIS — M79674 Pain in right toe(s): Secondary | ICD-10-CM | POA: Diagnosis not present

## 2023-02-19 DIAGNOSIS — H6993 Unspecified Eustachian tube disorder, bilateral: Secondary | ICD-10-CM | POA: Diagnosis not present

## 2023-02-19 DIAGNOSIS — H7291 Unspecified perforation of tympanic membrane, right ear: Secondary | ICD-10-CM | POA: Diagnosis not present

## 2023-02-19 DIAGNOSIS — B3731 Acute candidiasis of vulva and vagina: Secondary | ICD-10-CM | POA: Diagnosis not present

## 2023-02-19 DIAGNOSIS — H6501 Acute serous otitis media, right ear: Secondary | ICD-10-CM | POA: Diagnosis not present
